# Patient Record
Sex: Female | Born: 1965 | Race: White | Hispanic: No | Marital: Married | Smoking: Never smoker
Health system: Southern US, Community
[De-identification: ages and names within clinical notes are randomized; demographics above are authoritative.]

## PROBLEM LIST (undated history)

## (undated) DIAGNOSIS — E785 Hyperlipidemia, unspecified: Secondary | ICD-10-CM

## (undated) DIAGNOSIS — B019 Varicella without complication: Secondary | ICD-10-CM

## (undated) DIAGNOSIS — R87619 Unspecified abnormal cytological findings in specimens from cervix uteri: Secondary | ICD-10-CM

## (undated) DIAGNOSIS — R519 Headache, unspecified: Secondary | ICD-10-CM

## (undated) DIAGNOSIS — M1990 Unspecified osteoarthritis, unspecified site: Secondary | ICD-10-CM

## (undated) DIAGNOSIS — G5600 Carpal tunnel syndrome, unspecified upper limb: Secondary | ICD-10-CM

## (undated) DIAGNOSIS — K5790 Diverticulosis of intestine, part unspecified, without perforation or abscess without bleeding: Secondary | ICD-10-CM

## (undated) HISTORY — DX: Carpal tunnel syndrome, unspecified upper limb: G56.00

## (undated) HISTORY — DX: Diverticulosis of intestine, part unspecified, without perforation or abscess without bleeding: K57.90

## (undated) HISTORY — DX: Unspecified abnormal cytological findings in specimens from cervix uteri: R87.619

## (undated) HISTORY — DX: Headache, unspecified: R51.9

## (undated) HISTORY — DX: Unspecified osteoarthritis, unspecified site: M19.90

## (undated) HISTORY — PX: COLPOSCOPY: SHX161

## (undated) HISTORY — DX: Hyperlipidemia, unspecified: E78.5

## (undated) HISTORY — DX: Varicella without complication: B01.9

---

## 1997-10-25 ENCOUNTER — Observation Stay (HOSPITAL_COMMUNITY): Admission: RE | Admit: 1997-10-25 | Discharge: 1997-10-25 | Payer: Self-pay | Admitting: Neurosurgery

## 2013-12-15 DIAGNOSIS — R87612 Low grade squamous intraepithelial lesion on cytologic smear of cervix (LGSIL): Secondary | ICD-10-CM | POA: Insufficient documentation

## 2013-12-15 DIAGNOSIS — Z8742 Personal history of other diseases of the female genital tract: Secondary | ICD-10-CM | POA: Insufficient documentation

## 2015-11-15 LAB — HM COLONOSCOPY

## 2016-08-21 ENCOUNTER — Other Ambulatory Visit: Payer: Self-pay | Admitting: Pediatrics

## 2016-08-21 DIAGNOSIS — Z1231 Encounter for screening mammogram for malignant neoplasm of breast: Secondary | ICD-10-CM

## 2016-08-30 ENCOUNTER — Ambulatory Visit
Admission: RE | Admit: 2016-08-30 | Discharge: 2016-08-30 | Disposition: A | Discharge status: Home / Self Care | Payer: BLUE CROSS/BLUE SHIELD | Source: Ambulatory Visit | Attending: Pediatrics | Admitting: Pediatrics

## 2016-08-30 DIAGNOSIS — Z1231 Encounter for screening mammogram for malignant neoplasm of breast: Secondary | ICD-10-CM | POA: Diagnosis not present

## 2016-09-05 ENCOUNTER — Other Ambulatory Visit: Payer: Self-pay | Admitting: *Deleted

## 2016-09-05 ENCOUNTER — Inpatient Hospital Stay
Admission: RE | Admit: 2016-09-05 | Discharge: 2016-09-05 | Disposition: A | Discharge status: Home / Self Care | Payer: Self-pay | Source: Ambulatory Visit | Attending: *Deleted | Admitting: *Deleted

## 2016-09-05 DIAGNOSIS — Z9289 Personal history of other medical treatment: Secondary | ICD-10-CM

## 2017-05-07 DIAGNOSIS — Z124 Encounter for screening for malignant neoplasm of cervix: Secondary | ICD-10-CM | POA: Diagnosis not present

## 2017-05-07 DIAGNOSIS — Z1231 Encounter for screening mammogram for malignant neoplasm of breast: Secondary | ICD-10-CM | POA: Diagnosis not present

## 2017-05-07 DIAGNOSIS — R87610 Atypical squamous cells of undetermined significance on cytologic smear of cervix (ASC-US): Secondary | ICD-10-CM | POA: Diagnosis not present

## 2017-06-17 DIAGNOSIS — R87619 Unspecified abnormal cytological findings in specimens from cervix uteri: Secondary | ICD-10-CM | POA: Diagnosis not present

## 2017-06-17 DIAGNOSIS — R87610 Atypical squamous cells of undetermined significance on cytologic smear of cervix (ASC-US): Secondary | ICD-10-CM | POA: Diagnosis not present

## 2017-06-17 DIAGNOSIS — Z3202 Encounter for pregnancy test, result negative: Secondary | ICD-10-CM | POA: Diagnosis not present

## 2017-06-17 DIAGNOSIS — N870 Mild cervical dysplasia: Secondary | ICD-10-CM | POA: Diagnosis not present

## 2017-07-23 ENCOUNTER — Other Ambulatory Visit: Payer: Self-pay | Admitting: Nurse Practitioner

## 2017-07-23 DIAGNOSIS — Z1231 Encounter for screening mammogram for malignant neoplasm of breast: Secondary | ICD-10-CM

## 2017-09-08 ENCOUNTER — Ambulatory Visit
Admission: RE | Admit: 2017-09-08 | Discharge: 2017-09-08 | Disposition: A | Discharge status: Home / Self Care | Payer: BLUE CROSS/BLUE SHIELD | Source: Ambulatory Visit | Attending: Nurse Practitioner | Admitting: Nurse Practitioner

## 2017-09-08 DIAGNOSIS — Z1231 Encounter for screening mammogram for malignant neoplasm of breast: Secondary | ICD-10-CM | POA: Insufficient documentation

## 2018-08-18 ENCOUNTER — Encounter: Payer: Self-pay | Admitting: Obstetrics and Gynecology

## 2018-08-26 ENCOUNTER — Telehealth: Payer: Self-pay

## 2018-08-26 NOTE — Telephone Encounter (Signed)
Coronavirus (COVID-19) Are you at risk?  Are you at risk for the Coronavirus (COVID-19)?  To be considered HIGH RISK for Coronavirus (COVID-19), you have to meet the following criteria:  . Traveled to [place], [place], [place], [place] or [place]; or in the [place] to [place], [place], [place], or [place]; and have fever, cough, and shortness of breath within the last 2 weeks of travel OR . Been in close contact with a person diagnosed with COVID-19 within the last 2 weeks and have fever, cough, and shortness of breath . IF YOU DO NOT MEET THESE CRITERIA, YOU ARE CONSIDERED LOW RISK FOR COVID-19.  What to do if you are HIGH RISK for COVID-19?  . If you are having a medical emergency, call 911. . Seek medical care right away. Before you go to a doctor's office, urgent care or emergency department, call ahead and tell them about your recent travel, contact with someone diagnosed with COVID-19, and your symptoms. You should receive instructions from your physician's office regarding next steps of care.  . When you arrive at healthcare provider, tell the healthcare staff immediately you have returned from visiting [place], [place], [place], [place] or [place]; or traveled in the [place] to [place], [place], [place], or [place]; in the last two weeks or you have been in close contact with a person diagnosed with COVID-19 in the last 2 weeks.   . Tell the health care staff about your symptoms: fever, cough and shortness of breath. . After you have been seen by a medical provider, you will be either: o Tested for (COVID-19) and discharged home on quarantine except to seek medical care if symptoms worsen, and asked to  - Stay home and avoid contact with others until you get your results (4-5 days)  - Avoid travel on public transportation if possible (such as bus, train, or airplane) or o Sent to the Emergency Department by EMS for evaluation, COVID-19 testing, and possible  admission depending on your condition and test results.  What to do if you are LOW RISK for COVID-19?  Reduce your risk of any infection by using the same precautions used for avoiding the common cold or flu:  . Wash your hands often with soap and warm water for at least 20 seconds.  If soap and water are not readily available, use an alcohol-based hand sanitizer with at least 60% alcohol.  . If coughing or sneezing, cover your mouth and nose by coughing or sneezing into the elbow areas of your shirt or coat, into a tissue or into your sleeve (not your hands). . Avoid shaking hands with others and consider head nods or verbal greetings only. . Avoid touching your eyes, nose, or mouth with unwashed hands.  . Avoid close contact with people who are sick. . Avoid places or events with large numbers of people in one location, like concerts or sporting events. . Carefully consider travel plans you have or are making. . If you are planning any travel outside or inside the [place], visit the CDC's Travelers' Health webpage for the latest health notices. . If you have some symptoms but not all symptoms, continue to monitor at home and seek medical attention if your symptoms worsen. . If you are having a medical emergency, call 911.   ADDITIONAL HEALTHCARE OPTIONS FOR PATIENTS  [facility] / e-Visit: [email or web address]         [facility]: [phone]  [facility]: [phone]                   [facility]: [phone]   Prescreened. Neg .cm 

## 2018-08-27 ENCOUNTER — Other Ambulatory Visit: Payer: Self-pay

## 2018-08-27 ENCOUNTER — Other Ambulatory Visit (HOSPITAL_COMMUNITY)
Admission: RE | Admit: 2018-08-27 | Discharge: 2018-08-27 | Disposition: A | Discharge status: Home / Self Care | Payer: BLUE CROSS/BLUE SHIELD | Source: Ambulatory Visit | Attending: Obstetrics and Gynecology | Admitting: Obstetrics and Gynecology

## 2018-08-27 ENCOUNTER — Encounter: Payer: Self-pay | Admitting: Obstetrics and Gynecology

## 2018-08-27 ENCOUNTER — Ambulatory Visit (INDEPENDENT_AMBULATORY_CARE_PROVIDER_SITE_OTHER): Payer: BC Managed Care – PPO | Admitting: Obstetrics and Gynecology

## 2018-08-27 VITALS — BP 131/88 | HR 99 | Ht 63.5 in | Wt 206.4 lb

## 2018-08-27 DIAGNOSIS — Z01419 Encounter for gynecological examination (general) (routine) without abnormal findings: Secondary | ICD-10-CM | POA: Diagnosis not present

## 2018-08-27 DIAGNOSIS — Z6835 Body mass index (BMI) 35.0-35.9, adult: Secondary | ICD-10-CM | POA: Diagnosis not present

## 2018-08-27 DIAGNOSIS — R87619 Unspecified abnormal cytological findings in specimens from cervix uteri: Secondary | ICD-10-CM

## 2018-08-27 DIAGNOSIS — N926 Irregular menstruation, unspecified: Secondary | ICD-10-CM

## 2018-08-27 DIAGNOSIS — Z713 Dietary counseling and surveillance: Secondary | ICD-10-CM | POA: Diagnosis not present

## 2018-08-27 MED ORDER — PHENTERMINE HCL 37.5 MG PO TABS: 37.5000 mg | ORAL_TABLET | Freq: Every day | ORAL | 2 refills | Status: DC

## 2018-08-27 MED ORDER — CYANOCOBALAMIN 1000 MCG/ML IJ SOLN: 1000.0000 ug | INTRAMUSCULAR | 1 refills | Status: DC

## 2018-08-27 NOTE — Progress Notes (Signed)
Subjective:   Angela Gregory is a 53 y.o. G75P2 Caucasian female here for a routine well-woman exam.  Patient's last menstrual period was 08/14/2018.    Current complaints: irregular menses about every 3 months,  PCP: Goins       does desire labs  Social History: Sexual: heterosexual Marital Status: separated Living situation: with partner / significant other Occupation: Artist Tobacco/alcohol: no tobacco use Illicit drugs: no history of illicit drug use  The following portions of the patient's history were reviewed and updated as appropriate: allergies, current medications, past family history, past medical history, past social history, past surgical history and problem list.  Past Medical History Past Medical History:  Diagnosis Date  . Abnormal Pap smear of cervix     Past Surgical History Past Surgical History:  Procedure Laterality Date  . CESAREAN SECTION    . COLPOSCOPY      Gynecologic History G2P2  Patient's last menstrual period was 08/14/2018. Contraception: none Last Pap: 04/2017. Results were: abnormal Last mammogram: 2019. Results were: normal  Obstetric History OB History  Gravida Para Term Preterm AB Living  2 2          SAB TAB Ectopic Multiple Live Births               # Outcome Date GA Lbr Len/2nd Weight Sex Delivery Anes PTL Lv  2 Para 1997    F CS-LTranv     1 Para 1994    F Vag-Spont EPI      Current Medications No current outpatient medications on file prior to visit.   No current facility-administered medications on file prior to visit.     Review of Systems Patient denies any headaches, blurred vision, shortness of breath, chest pain, abdominal pain, problems with bowel movements, urination, or intercourse.  Objective:  BP 131/88   Pulse 99   Ht 5' 3.5" (1.613 m)   Wt 206 lb 6.4 oz (93.6 kg)   LMP 08/14/2018   BMI 35.99 kg/m  Physical Exam  General:  Well developed, well nourished, no acute distress. She is alert and  oriented x3. Skin:  Warm and dry Neck:  Midline trachea, no thyromegaly or nodules Cardiovascular: Regular rate and rhythm, no murmur heard Lungs:  Effort normal, all lung fields clear to auscultation bilaterally Breasts:  No dominant palpable mass, retraction, or nipple discharge Abdomen:  Soft, non tender, no hepatosplenomegaly or masses Pelvic:  External genitalia is normal in appearance.  The vagina is normal in appearance. The cervix is bulbous, no CMT.  Thin prep pap is done with HR HPV cotesting. Uterus is felt to be normal size, shape, and contour.  No adnexal masses or tenderness noted. Extremities:  No swelling or varicosities noted Psych:  She has a normal mood and affect  Assessment:   Healthy well-woman exam H/o abnormal pap H/o elevated cholesterol BMI 35 Irregular menses  Plan:  Labs will be drawn fasting- will follow up accordingly. Desires starting weight loss program - used adipex in past. B12 given today, will RTC in 4 weeks for labs and wt/bp/B12. F/U 1 year for AE, or sooner if needed Mammogram ordered    Rockney Ghee, CNM

## 2018-08-27 NOTE — Patient Instructions (Addendum)
Exercising to Lose Weight Exercise is structured, repetitive physical activity to improve fitness and health. Getting regular exercise is important for everyone. It is especially important if you are overweight. Being overweight increases your risk of heart disease, stroke, diabetes, high blood pressure, and several types of cancer. Reducing your calorie intake and exercising can help you lose weight. Exercise is usually categorized as moderate or vigorous intensity. To lose weight, most people need to do a certain amount of moderate-intensity or vigorous-intensity exercise each week. Moderate-intensity exercise  Moderate-intensity exercise is any activity that gets you moving enough to burn at least three times more energy (calories) than if you were sitting. Examples of moderate exercise include:  Walking a mile in 15 minutes.  Doing light yard work.  Biking at an easy pace. Most people should get at least 150 minutes (2 hours and 30 minutes) a week of moderate-intensity exercise to maintain their body weight. Vigorous-intensity exercise Vigorous-intensity exercise is any activity that gets you moving enough to burn at least six times more calories than if you were sitting. When you exercise at this intensity, you should be working hard enough that you are not able to carry on a conversation. Examples of vigorous exercise include:  Running.  Playing a team sport, such as football, basketball, and soccer.  Jumping rope. Most people should get at least 75 minutes (1 hour and 15 minutes) a week of vigorous-intensity exercise to maintain their body weight. How can exercise affect me? When you exercise enough to burn more calories than you eat, you lose weight. Exercise also reduces body fat and builds muscle. The more muscle you have, the more calories you burn. Exercise also:  Improves mood.  Reduces stress and tension.  Improves your overall fitness, flexibility, and  endurance.  Increases bone strength. The amount of exercise you need to lose weight depends on:  Your age.  The type of exercise.  Any health conditions you have.  Your overall physical ability. Talk to your health care provider about how much exercise you need and what types of activities are safe for you. What actions can I take to lose weight? Nutrition   Make changes to your diet as told by your health care provider or diet and nutrition specialist (dietitian). This may include: ? Eating fewer calories. ? Eating more protein. ? Eating less unhealthy fats. ? Eating a diet that includes fresh fruits and vegetables, whole grains, low-fat dairy products, and lean protein. ? Avoiding foods with added fat, salt, and sugar.  Drink plenty of water while you exercise to prevent dehydration or heat stroke. Activity  Choose an activity that you enjoy and set realistic goals. Your health care provider can help you make an exercise plan that works for you.  Exercise at a moderate or vigorous intensity most days of the week. ? The intensity of exercise may vary from person to person. You can tell how intense a workout is for you by paying attention to your breathing and heartbeat. Most people will notice their breathing and heartbeat get faster with more intense exercise.  Do resistance training twice each week, such as: ? Push-ups. ? Sit-ups. ? Lifting weights. ? Using resistance bands.  Getting short amounts of exercise can be just as helpful as long structured periods of exercise. If you have trouble finding time to exercise, try to include exercise in your daily routine. ? Get up, stretch, and walk around every 30 minutes throughout the day. ? Go for a  walk during your lunch break. ? Park your car farther away from your destination. ? If you take public transportation, get off one stop early and walk the rest of the way. ? Make phone calls while standing up and walking  around. ? Take the stairs instead of elevators or escalators.  Wear comfortable clothes and shoes with good support.  Do not exercise so much that you hurt yourself, feel dizzy, or get very short of breath. Where to find more information  [place] Department of Health and Human Services: ThisPath.fi  Centers for Disease Control and Prevention (CDC): FootballExhibition.com.br Contact a health care provider:  Before starting a new exercise program.  If you have questions or concerns about your weight.  If you have a medical problem that keeps you from exercising. Get help right away if you have any of the following while exercising:  Injury.  Dizziness.  Difficulty breathing or shortness of breath that does not go away when you stop exercising.  Chest pain.  Rapid heartbeat. Summary  Being overweight increases your risk of heart disease, stroke, diabetes, high blood pressure, and several types of cancer.  Losing weight happens when you burn more calories than you eat.  Reducing the amount of calories you eat in addition to getting regular moderate or vigorous exercise each week helps you lose weight. This information is not intended to replace advice given to you by your health care provider. Make sure you discuss any questions you have with your health care provider. Document Released: 04/06/2010 Document Revised: 03/17/2017 Document Reviewed: 03/17/2017 Elsevier Interactive Patient Education  2019 Elsevier Inc. Cholesterol Cholesterol is a white, waxy, fat-like substance that is needed by the human body in small amounts. The liver makes all the cholesterol we need. Cholesterol is carried from the liver by the blood through the blood vessels. Deposits of cholesterol (plaques) may build up on blood vessel (artery) walls. Plaques make the arteries narrower and stiffer. Cholesterol plaques increase the risk for heart attack and stroke. You cannot feel your cholesterol level even if it is very  high. The only way to know that it is high is to have a blood test. Once you know your cholesterol levels, you should keep a record of the test results. Work with your health care provider to keep your levels in the desired range. What do the results mean?  Total cholesterol is a rough measure of all the cholesterol in your blood.  LDL (low-density lipoprotein) is the bad cholesterol. This is the type that causes plaque to build up on the artery walls. You want this level to be low.  HDL (high-density lipoprotein) is the good cholesterol because it cleans the arteries and carries the LDL away. You want this level to be high.  Triglycerides are fat that the body can either burn for energy or store. High levels are closely linked to heart disease. What are the desired levels of cholesterol?  Total cholesterol below 200.  LDL below 100 for people who are at risk, below 70 for people at very high risk.  HDL above 40 is good. A level of 60 or higher is considered to be protective against heart disease.  Triglycerides below 150. How can I lower my cholesterol? Diet Follow your diet program as told by your health care provider.  Choose fish or white meat chicken and [place], roasted or baked. Limit fatty cuts of red meat, fried foods, and processed meats, such as sausage and lunch meats.  Eat lots  of fresh fruits and vegetables.  Choose whole grains, beans, pasta, potatoes, and cereals.  Choose olive oil, corn oil, or canola oil, and use only small amounts.  Avoid butter, mayonnaise, shortening, or palm kernel oils.  Avoid foods with trans fats.  Drink skim or nonfat milk and eat low-fat or nonfat yogurt and cheeses. Avoid whole milk, cream, ice cream, egg yolks, and full-fat cheeses.  Healthier desserts include angel food cake, ginger snaps, animal crackers, hard candy, popsicles, and low-fat or nonfat frozen yogurt. Avoid pastries, cakes, pies, and cookies.  Exercise  Follow  your exercise program as told by your health care provider. A regular program: ? Helps to decrease LDL and raise HDL. ? Helps with weight control.  Do things that increase your activity level, such as gardening, walking, and taking the stairs.  Ask your health care provider about ways that you can be more active in your daily life. Medicine  Take over-the-counter and prescription medicines only as told by your health care provider. ? Medicine may be prescribed by your health care provider to help lower cholesterol and decrease the risk for heart disease. This is usually done if diet and exercise have failed to bring down cholesterol levels. ? If you have several risk factors, you may need medicine even if your levels are normal. This information is not intended to replace advice given to you by your health care provider. Make sure you discuss any questions you have with your health care provider. Document Released: 11/27/2000 Document Revised: 09/30/2015 Document Reviewed: 09/02/2015 Elsevier Interactive Patient Education  2019 Elsevier Inc.  Human Papillomavirus Human papillomavirus (HPV) is the most common sexually transmitted infection (STI). It easily spreads from person to person (is highly contagious). HPV infections cause genital warts. Certain types of HPV may cause cancers, including cancer of the lower part of the uterus (cervix), vagina, outer female genital area (vulva), penis, anus, and rectum. HPV may also cause cancers of the oral cavity, such as the throat, tongue, and tonsils. There are many types of HPV. It usually does not cause symptoms. However, sometimes there are wart-like lesions in the throat or warts in the genital area that you can see or feel. It is possible to be infected for long periods and pass HPV to others without knowing it. What are the causes? HPV is caused by a virus that spreads from person to person through sexual contact. This includes oral, vaginal, or  anal sex. What increases the risk? The following factors may make you more likely to develop this condition:  Having unprotected oral, vaginal, or anal sex.  Having several sex partners.  Having a sex partner who has other sex partners.  Having or having had another STI.  Having a weak disease-fighting (immune) system.  Having damaged skin in the genital area. What are the signs or symptoms? Most people who have HPV do not have any symptoms. If symptoms are present, they may include:  Wartlike lesions in the throat (from having oral sex).  Warts on the infected skin or mucous membranes.  Genital warts that may itch, burn, bleed, or be painful during sexual intercourse. How is this diagnosed? If wartlike lesions are present in the throat or if genital warts are present, your health care provider can usually diagnose HPV with a physical exam. Genital warts are easily seen. In females, tests may be used to diagnose HPV, including:  A Pap test. A Pap test takes a sample of cells from your cervix to  check for cancer and HPV infection.  An HPV test. This is similar to a Pap test and involves taking a sample of cells from your cervix.  Using a scope to view the cervix (colposcopy). This may be done if a pelvic exam or Pap test is abnormal. A sample of tissue may be removed for testing (biopsy) during the colposcopy. Currently, there is no test to detect HPV in males. How is this treated? There is no treatment for the virus itself. However, there are treatments for the health problems and symptoms HPV can cause. Your health care provider will monitor you closely after you are treated as HPV can come back and may need treatment again. Treatment for HPV may include:  Medicines, which may be injected or applied to genital warts in a cream, lotion, liquid or gel form.  Use of a probe to apply extreme cold (cryotherapy) to the genital warts.  Application of an intense beam of light (laser  treatment) on the genital warts.  Use of a probe to apply extreme heat (electrocautery) on the genital warts.  Surgery to remove the genital warts. Follow these instructions at home: Medicines  Take over-the-counter and prescription medicines only as told by your health care provider. This include creams for itching or irritation.  Do not treat genital warts with medicines used for treating hand warts. General instructions  Do not touch or scratch the warts.  Do not have sex while you are being treated.  Do not douche or use tampons during treatment (women).  Tell your sex partner about your infection. He or she may also need to be treated.  If you become pregnant, tell your health care provider that you have HPV. Your health care provider will monitor you closely during pregnancy to make sure your baby is safe.  Keep all follow-up visits as told by your health care provider. This is important. How is this prevented?  Talk with your health care provider about getting the HPV vaccines. These vaccines prevent some HPV infections and cancers. The vaccines are recommended for males and females between the ages of 439 and 4126. They will not work if you already have HPV, and they are not recommended for pregnant women.  After treatment, use condoms during sex to prevent future infections.  Have only one sex partner.  Have a sex partner who does not have other sex partners.  Get regular Pap tests as directed by your health care provider. Contact a health care provider if:  The treated skin becomes red, swollen, or painful.  You have a fever.  You feel generally ill.  You feel lumps or pimples sticking out in and around your genital area.  You develop bleeding of the vagina or the treatment area.  You have painful sexual intercourse. Summary  Human papillomavirus (HPV) is the most common sexually transmitted infection (STI) and is highly contagious.  Most people carrying HPV  do not have any symptoms.  HPV can be prevented with vaccination. The vaccine is recommended for males and females between the ages of 569 and 3026.  There is no treatment for the virus itself. However, there are treatments for the health problems and symptoms HPV can cause. This information is not intended to replace advice given to you by your health care provider. Make sure you discuss any questions you have with your health care provider. Document Released: 05/25/2003 Document Revised: 02/11/2016 Document Reviewed: 02/11/2016 Elsevier Interactive Patient Education  2019 [employer]  Menopause Menopause  is the normal time of life when menstrual periods stop completely. It is usually confirmed by 12 months without a menstrual period. The transition to menopause (perimenopause) most often happens between the ages of 57 and 59. During perimenopause, hormone levels change in your body, which can cause symptoms and affect your health. Menopause may increase your risk for:  Loss of bone (osteoporosis), which causes bone breaks (fractures).  Depression.  Hardening and narrowing of the arteries (atherosclerosis), which can cause heart attacks and strokes. What are the causes? This condition is usually caused by a natural change in hormone levels that happens as you get older. The condition may also be caused by surgery to remove both ovaries (bilateral oophorectomy). What increases the risk? This condition is more likely to start at an earlier age if you have certain medical conditions or treatments, including:  A tumor of the pituitary gland in the brain.  A disease that affects the ovaries and hormone production.  Radiation treatment for cancer.  Certain cancer treatments, such as chemotherapy or hormone (anti-estrogen) therapy.  Heavy smoking and excessive alcohol use.  Family history of early menopause. This condition is also more likely to develop earlier in women who are very  thin. What are the signs or symptoms? Symptoms of this condition include:  Hot flashes.  Irregular menstrual periods.  Night sweats.  Changes in feelings about sex. This could be a decrease in sex drive or an increased comfort around your sexuality.  Vaginal dryness and thinning of the vaginal walls. This may cause painful intercourse.  Dryness of the skin and development of wrinkles.  Headaches.  Problems sleeping (insomnia).  Mood swings or irritability.  Memory problems.  Weight gain.  Hair growth on the face and chest.  Bladder infections or problems with urinating. How is this diagnosed? This condition is diagnosed based on your medical history, a physical exam, your age, your menstrual history, and your symptoms. Hormone tests may also be done. How is this treated? In some cases, no treatment is needed. You and your health care provider should make a decision together about whether treatment is necessary. Treatment will be based on your individual condition and preferences. Treatment for this condition focuses on managing symptoms. Treatment may include:  Menopausal hormone therapy (MHT).  Medicines to treat specific symptoms or complications.  Acupuncture.  Vitamin or herbal supplements. Before starting treatment, make sure to let your health care provider know if you have a personal or family history of:  Heart disease.  Breast cancer.  Blood clots.  Diabetes.  Osteoporosis. Follow these instructions at home: Lifestyle  Do not use any products that contain nicotine or tobacco, such as cigarettes and e-cigarettes. If you need help quitting, ask your health care provider.  Get at least 30 minutes of physical activity on 5 or more days each week.  Avoid alcoholic and caffeinated beverages, as well as spicy foods. This may help prevent hot flashes.  Get 7-8 hours of sleep each night.  If you have hot flashes, try: ? Dressing in layers. ? Avoiding  things that may trigger hot flashes, such as spicy food, warm places, or stress. ? Taking slow, deep breaths when a hot flash starts. ? Keeping a fan in your home and office.  Find ways to manage stress, such as deep breathing, meditation, or journaling.  Consider going to group therapy with other women who are having menopause symptoms. Ask your health care provider about recommended group therapy meetings. Eating and drinking  Eat a healthy, balanced diet that contains whole grains, lean protein, low-fat dairy, and plenty of fruits and vegetables.  Your health care provider may recommend adding more soy to your diet. Foods that contain soy include tofu, tempeh, and soy milk.  Eat plenty of foods that contain calcium and vitamin D for bone health. Items that are rich in calcium include low-fat milk, yogurt, beans, almonds, sardines, broccoli, and kale. Medicines  Take over-the-counter and prescription medicines only as told by your health care provider.  Talk with your health care provider before starting any herbal supplements. If prescribed, take vitamins and supplements as told by your health care provider. These may include: ? Calcium. Women age 54 and older should get 1,200 mg (milligrams) of calcium every day. ? Vitamin D. Women need 600-800 International Units of vitamin D each day. ? Vitamins B12 and B6. Aim for 50 micrograms of B12 and 1.5 mg of B6 each day. General instructions  Keep track of your menstrual periods, including: ? When they occur. ? How heavy they are and how long they last. ? How much time passes between periods.  Keep track of your symptoms, noting when they start, how often you have them, and how long they last.  Use vaginal lubricants or moisturizers to help with vaginal dryness and improve comfort during sex.  Keep all follow-up visits as told by your health care provider. This is important. This includes any group therapy or counseling. Contact a  health care provider if:  You are still having menstrual periods after age 555.  You have pain during sex.  You have not had a period for 12 months and you develop vaginal bleeding. Get help right away if:  You have: ? Severe depression. ? Excessive vaginal bleeding. ? Pain when you urinate. ? A fast or irregular heart beat (palpitations). ? Severe headaches. ? Abdomen (abdominal) pain or severe indigestion.  You fell and you think you have a broken bone.  You develop leg or chest pain.  You develop vision problems.  You feel a lump in your breast. Summary  Menopause is the normal time of life when menstrual periods stop completely. It is usually confirmed by 12 months without a menstrual period.  The transition to menopause (perimenopause) most often happens between the ages of 7045 and 7455.  Symptoms can be managed through medicines, lifestyle changes, and complementary therapies such as acupuncture.  Eat a balanced diet that is rich in nutrients to promote bone health and heart health and to manage symptoms during menopause. This information is not intended to replace advice given to you by your health care provider. Make sure you discuss any questions you have with your health care provider. Document Released: 05/25/2003 Document Revised: 04/06/2016 Document Reviewed: 04/06/2016 Elsevier Interactive Patient Education  2019 [employer]

## 2018-09-04 LAB — CYTOLOGY - PAP
Diagnosis: NEGATIVE
HPV 16/18/45 genotyping: NEGATIVE
HPV: DETECTED — AB

## 2018-09-24 ENCOUNTER — Encounter: Payer: BC Managed Care – PPO | Admitting: Obstetrics and Gynecology

## 2018-10-30 ENCOUNTER — Encounter: Payer: BC Managed Care – PPO | Admitting: Obstetrics and Gynecology

## 2018-12-11 ENCOUNTER — Ambulatory Visit
Admission: RE | Admit: 2018-12-11 | Discharge: 2018-12-11 | Disposition: A | Discharge status: Home / Self Care | Payer: BC Managed Care – PPO | Source: Ambulatory Visit | Attending: Obstetrics and Gynecology | Admitting: Obstetrics and Gynecology

## 2018-12-11 DIAGNOSIS — Z01419 Encounter for gynecological examination (general) (routine) without abnormal findings: Secondary | ICD-10-CM | POA: Insufficient documentation

## 2018-12-11 DIAGNOSIS — Z1231 Encounter for screening mammogram for malignant neoplasm of breast: Secondary | ICD-10-CM | POA: Insufficient documentation

## 2018-12-21 ENCOUNTER — Other Ambulatory Visit: Payer: Self-pay

## 2018-12-21 ENCOUNTER — Ambulatory Visit (INDEPENDENT_AMBULATORY_CARE_PROVIDER_SITE_OTHER): Payer: BC Managed Care – PPO | Admitting: Primary Care

## 2018-12-21 ENCOUNTER — Encounter: Payer: Self-pay | Admitting: Primary Care

## 2018-12-21 VITALS — BP 126/86 | HR 88 | Temp 97.7°F | Ht 62.75 in | Wt 219.5 lb

## 2018-12-21 DIAGNOSIS — G4730 Sleep apnea, unspecified: Secondary | ICD-10-CM

## 2018-12-21 DIAGNOSIS — G5603 Carpal tunnel syndrome, bilateral upper limbs: Secondary | ICD-10-CM

## 2018-12-21 DIAGNOSIS — K219 Gastro-esophageal reflux disease without esophagitis: Secondary | ICD-10-CM

## 2018-12-21 DIAGNOSIS — E785 Hyperlipidemia, unspecified: Secondary | ICD-10-CM | POA: Diagnosis not present

## 2018-12-21 DIAGNOSIS — Z832 Family history of diseases of the blood and blood-forming organs and certain disorders involving the immune mechanism: Secondary | ICD-10-CM

## 2018-12-21 DIAGNOSIS — Z23 Encounter for immunization: Secondary | ICD-10-CM

## 2018-12-21 DIAGNOSIS — G43709 Chronic migraine without aura, not intractable, without status migrainosus: Secondary | ICD-10-CM | POA: Diagnosis not present

## 2018-12-21 DIAGNOSIS — G5600 Carpal tunnel syndrome, unspecified upper limb: Secondary | ICD-10-CM | POA: Insufficient documentation

## 2018-12-21 LAB — COMPREHENSIVE METABOLIC PANEL
ALT: 26 U/L (ref 0–35)
AST: 17 U/L (ref 0–37)
Albumin: 4.3 g/dL (ref 3.5–5.2)
Alkaline Phosphatase: 59 U/L (ref 39–117)
BUN: 9 mg/dL (ref 6–23)
CO2: 30 mEq/L (ref 19–32)
Calcium: 9.5 mg/dL (ref 8.4–10.5)
Chloride: 100 mEq/L (ref 96–112)
Creatinine, Ser: 0.73 mg/dL (ref 0.40–1.20)
GFR: 82.98 mL/min (ref >60.00)
Glucose, Bld: 86 mg/dL (ref 70–99)
Potassium: 4.1 mEq/L (ref 3.5–5.1)
Sodium: 138 mEq/L (ref 135–145)
Total Bilirubin: 0.6 mg/dL (ref 0.2–1.2)
Total Protein: 6.5 g/dL (ref 6.0–8.3)

## 2018-12-21 LAB — CBC
HCT: 43.2 % (ref 36.0–46.0)
Hemoglobin: 14.9 g/dL (ref 12.0–15.0)
MCHC: 34.4 g/dL (ref 30.0–36.0)
MCV: 91.6 fl (ref 78.0–100.0)
Platelets: 243 10*3/uL (ref 150.0–400.0)
RBC: 4.72 Mil/uL (ref 3.87–5.11)
RDW: 12.6 % (ref 11.5–15.5)
WBC: 9.7 10*3/uL (ref 4.0–10.5)

## 2018-12-21 LAB — LIPID PANEL
Cholesterol: 264 mg/dL — ABNORMAL HIGH (ref 0–200)
HDL: 48.7 mg/dL (ref >39.00)
LDL Cholesterol: 181 mg/dL — ABNORMAL HIGH (ref 0–99)
NonHDL: 215.65
Total CHOL/HDL Ratio: 5
Triglycerides: 172 mg/dL — ABNORMAL HIGH (ref 0.0–149.0)
VLDL: 34.4 mg/dL (ref 0.0–40.0)

## 2018-12-21 LAB — HEMOGLOBIN A1C: Hgb A1c MFr Bld: 5.7 % (ref 4.6–6.5)

## 2018-12-21 LAB — TSH: TSH: 1.48 u[IU]/mL (ref 0.35–4.50)

## 2018-12-21 MED ORDER — SUMATRIPTAN SUCCINATE 25 MG PO TABS: ORAL_TABLET | ORAL | 0 refills | Status: DC

## 2018-12-21 NOTE — Patient Instructions (Signed)
Stop by the lab prior to leaving today. I will notify you of your results once received.   You will be contacted regarding your referral to pulmonology for CPAP calibration.  Please let us know if you have not been contacted within one week.   You can take the sumatriptan (Imitrex) at migraine onset, may repeat in 2 hours if migraine persists.   Start exercising. You should be getting 150 minutes of moderate intensity exercise weekly.  It's important to improve your diet by reducing consumption of fast food, fried food, processed snack foods, sugary drinks. Increase consumption of fresh vegetables and fruits, whole grains, water.  Ensure you are drinking 64 ounces of water daily.  It was a pleasure to meet you today! Please don't hesitate to call or message me with any questions. Welcome to [facility]!

## 2018-12-21 NOTE — Assessment & Plan Note (Signed)
Diagnosed years ago, compliant to CPAP nightly, continue same. Referral placed to pulmonology for management.

## 2018-12-21 NOTE — Assessment & Plan Note (Signed)
Prior history, could not tolerate atorvastatin or rosuvastatin. Did well on Simvastatin so we will consider this for treatment if needed.  Lipid panel pending.

## 2018-12-21 NOTE — Assessment & Plan Note (Signed)
Overall infrequent, improved from years back. Rx for Imitrex provided to use PRN.  Continue to monitor.

## 2018-12-21 NOTE — Assessment & Plan Note (Signed)
Chronic with numbness and pain. Wearing braces now. She would like to continue with conservative treatment for now, will update if symptoms progress. Consider orthopedic referral if needed.

## 2018-12-21 NOTE — Progress Notes (Signed)
Subjective:    Patient ID: Angela Gregory, female    DOB: September 06, 1965, 53 y.o.   MRN: [id number]  HPI  [name] is a 53 year old female who presents today to establish care and to discuss the problems mentioned below. Will obtain/review records.  BP Readings from Last 3 Encounters:  12/21/18 126/86  08/27/18 131/88   1) Hyperlipidemia: Diagnosed years ago, was managed on Lipitor, Crestor, Simvastatin at various times. She had significant myalgias on Lipitor and Crestor, did better on Simvastatin. She has not had treatment in three years. No recent lipid panel on file.  2) Obesity: Currently prescribed Phentermine which was prescribed recently by her GYN. She is not taking Phentermine now due to side effects of dizziness and visual disturbance. She is not exercising, endorses weight gain during Covid-19.   3) Migraines/Frequent Headaches: Long history that dates back years ago. She will experience 1-2 migraines every 3-4 months which occur to the frontal lobes. Also with photophobia. She is now taking Tylenol and using ice with improvement. Previously managed on Imitrex which helped, has not taken in years. Never on preventative treatment.   4) OSA: Diagnosed around 2013. Compliant to CPAP nightly, no recent calibration. Moved from [place], [place] recently.  Review of Systems  Eyes: Negative for visual disturbance.  Respiratory:       Exertional SOB with moderate exertion   Cardiovascular: Negative for chest pain.  Neurological:       Migraines intermittently       Past Medical History:  Diagnosis Date  . Abnormal Pap smear of cervix   . Arthritis    Neck, hands  . Chickenpox   . Frequent headaches   . Hyperlipidemia      Social History   Socioeconomic History  . Marital status: Legally Separated    Spouse name: Not on file  . Number of children: Not on file  . Years of education: Not on file  . Highest education level: Not on file  Occupational History  . Not on file   Social Needs  . Financial resource strain: Not on file  . Food insecurity    Worry: Not on file    Inability: Not on file  . Transportation needs    Medical: Not on file    Non-medical: Not on file  Tobacco Use  . Smoking status: Never Smoker  . Smokeless tobacco: Never Used  Substance and Sexual Activity  . Alcohol use: Yes  . Drug use: Never  . Sexual activity: Yes  Lifestyle  . Physical activity    Days per week: Not on file    Minutes per session: Not on file  . Stress: Not on file  Relationships  . Social Musician on phone: Not on file    Gets together: Not on file    Attends religious service: Not on file    Active member of club or organization: Not on file    Attends meetings of clubs or organizations: Not on file    Relationship status: Not on file  . Intimate partner violence    Fear of current or ex partner: Not on file    Emotionally abused: Not on file    Physically abused: Not on file    Forced sexual activity: Not on file  Other Topics Concern  . Not on file  Social History Narrative   Divorced.   2 children.   Works in Audiological scientist.    Past Surgical History:  Procedure Laterality Date  . BACK SURGERY  2000   Disc removal L4-L5  . CESAREAN SECTION    . COLPOSCOPY      Family History  Problem Relation Age of Onset  . Arthritis Mother   . Heart disease Mother   . Hyperlipidemia Mother   . Diabetes Father   . Factor V Leiden deficiency Father   . Muscular dystrophy Maternal Grandmother   . Heart disease Maternal Grandfather   . COPD Paternal Grandmother   . Breast cancer Neg Hx     No Known Allergies  Current Outpatient Medications on File Prior to Visit  Medication Sig Dispense Refill  . acetaminophen (TYLENOL) 650 MG CR tablet Take 650 mg by mouth every 8 (eight) hours as needed for pain.    . Cetirizine HCl (ZYRTEC PO) Take by mouth.    . MELATONIN PO Take by mouth.     No current facility-administered medications on file  prior to visit.     BP 126/86   Pulse 88   Temp 97.7 F (36.5 C) (Temporal)   Ht 5' 2.75" (1.594 m)   Wt 219 lb 8 oz (99.6 kg)   SpO2 98%   BMI 39.19 kg/m    Objective:   Physical Exam  Constitutional: She appears well-nourished.  Neck: Neck supple.  Cardiovascular: Normal rate and regular rhythm.  Respiratory: Effort normal and breath sounds normal.  Skin: Skin is warm and dry.  Psychiatric: She has a normal mood and affect.           Assessment & Plan:

## 2018-12-22 DIAGNOSIS — Z832 Family history of diseases of the blood and blood-forming organs and certain disorders involving the immune mechanism: Secondary | ICD-10-CM | POA: Diagnosis not present

## 2018-12-22 MED ORDER — SIMVASTATIN 40 MG PO TABS: 40.0000 mg | ORAL_TABLET | Freq: Every evening | ORAL | 3 refills | Status: DC

## 2018-12-22 NOTE — Addendum Note (Signed)
Addended by: Cloyd Stagers on: 12/22/2018 02:15 PM   Modules accepted: Orders

## 2018-12-26 LAB — FACTOR 5 LEIDEN

## 2018-12-27 DIAGNOSIS — D6851 Activated protein C resistance: Secondary | ICD-10-CM

## 2018-12-28 ENCOUNTER — Institutional Professional Consult (permissible substitution): Payer: BC Managed Care – PPO | Admitting: Internal Medicine

## 2019-01-08 ENCOUNTER — Other Ambulatory Visit: Payer: BC Managed Care – PPO

## 2019-01-15 ENCOUNTER — Other Ambulatory Visit: Payer: Self-pay | Admitting: Primary Care

## 2019-01-15 DIAGNOSIS — G43709 Chronic migraine without aura, not intractable, without status migrainosus: Secondary | ICD-10-CM

## 2019-01-17 NOTE — Progress Notes (Signed)
[facility]  [address], [address] [place], [place] [address] Phone: [phone]  Fax: [phone]   Clinic Day:  01/18/2019  Referring physician: Doreene Nestlark, Katherine K, NP  Chief Complaint: Angela Gregory is a 53 y.o. female with a family history of Factor V Leiden who is referred in consultation by Vernona RiegerKatherine Clark, NP  for assessment and management.   HPI: The patient was seen by Vernona RiegerKatherine Clark, NP on 12/21/2018. She noted a family history of Factor V Leiden.  Her father had bilateral pulmonary emboli and a lower extremity DVT.  Hypercoagulable work-up revealed heterozygosity for Factor V Leiden.   There is no other family history of thrombosis.  The patient's brother was subsequently tested and found to be negative.  She underwent Factor V Leiden testing on 12/22/2018; results were positive for one copy (heterozygote).  She has never had a clot.  CBC followed  11/11/2014: Hematocrit 45.7, hemoglobin 14.7, MCV 90.6, platelets 316,000, WBC 12900. 12/21/2014 :Hematocrit 43.1, hemoglobin 14.3, MCV 90.0, platelets 287,000, WBC 13800 12/21/2018: Hematocrit 43.2, hemoglobin 14.9, MCV 91.6, platelets 243,000, WBC 9700.  She has 2 daughters, ages 5722 and 54 years old.  They have not been tested.  She believes both are on BC pills due to painful periods. Neither of them have had children.  Symptomatically, she feels good.  She denies any shortness of breath or lower extremity edema.  She hasn't had menses since 04/2018.  She has hot flashes and no night sweats. She is not on any hormone replacement therapy.  She is up-to-date on her mammogram and colonoscopy.   Past Medical History:  Diagnosis Date  . Abnormal Pap smear of cervix   . Arthritis    Neck, hands  . Carpal tunnel syndrome   . Chickenpox   . Diverticulosis   . Frequent headaches   . Hyperlipidemia     Past Surgical History:  Procedure Laterality Date  . BACK SURGERY  2000   Disc removal L4-L5  .  CESAREAN SECTION    . COLPOSCOPY      Family History  Problem Relation Age of Onset  . Arthritis Mother   . Heart disease Mother   . Hyperlipidemia Mother   . Diabetes Father   . Factor V Leiden deficiency Father   . Muscular dystrophy Maternal Grandmother   . Heart disease Maternal Grandfather   . COPD Paternal Grandmother   . Breast cancer Neg Hx     Social History:  reports that she has never smoked. She has never used smokeless tobacco. She reports current alcohol use. She reports that she does not use drugs. She has 2 daughters who are 5226 and 322.  Her father is Wendy Poetrnest Carden.  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 She sits normally for 8 hrs a day. She gets up at least every hour.  She lives in [place].  The patient is alone today.  Allergies: No Known Allergies  Current Medications: Current Outpatient Medications  Medication Sig Dispense Refill  . Cetirizine HCl (ZYRTEC PO) Take by mouth.    . simvastatin (ZOCOR) 40 MG tablet Take 1 tablet (40 mg total) by mouth every evening. For cholesterol. 90 tablet 3  . acetaminophen (TYLENOL) 650 MG CR tablet Take 650 mg by mouth every 8 (eight) hours as needed for pain.    Marland Kitchen. MELATONIN PO Take by mouth.    . SUMAtriptan (IMITREX) 25 MG tablet Take 1  tablet by mouth at migraine onset, may repeat in 2 hours if headache persists or recurs. (Patient not taking: Reported on 01/18/2019) 10 tablet 0   No current facility-administered medications for this visit.     Review of Systems  Constitutional: Negative.  Negative for chills, diaphoresis, fever, malaise/fatigue and weight loss.  HENT: Negative.  Negative for congestion, ear pain, hearing loss, nosebleeds, sinus pain and sore throat.   Eyes: Negative for blurred vision, double vision and photophobia.       Vision issues with migraines.  Respiratory: Positive for shortness of breath (attributes to weight gain). Negative  for cough, hemoptysis and sputum production.   Cardiovascular: Negative.  Negative for chest pain, palpitations and leg swelling.  Gastrointestinal: Negative.  Negative for abdominal pain, blood in stool, constipation, diarrhea, melena, nausea and vomiting.  Genitourinary: Negative.  Negative for dysuria, flank pain, frequency, hematuria and urgency.  Musculoskeletal: Positive for joint pain (hands) and neck pain (arthritis). Negative for back pain and myalgias.  Skin: Negative.  Negative for itching and rash.  Neurological: Positive for headaches (chronic migraines). Negative for dizziness, tingling, tremors, sensory change, speech change, focal weakness and weakness.  Psychiatric/Behavioral: Negative.  Negative for depression and memory loss. The patient is not nervous/anxious and does not have insomnia.    Performance status (ECOG): 1  Vitals Blood pressure (!) 146/83, pulse 89, temperature 98.5 F (36.9 C), temperature source Tympanic, resp. rate 18, height 5' 2.75" (1.594 m), weight 222 lb 0.1 oz (100.7 kg), SpO2 99 %.   Physical Exam  Constitutional: She is oriented to person, place, and time. She appears well-developed and well-nourished. No distress.  HENT:  Head: Normocephalic and atraumatic.  Mouth/Throat: Oropharynx is clear and moist. No oropharyngeal exudate.  Shoulder length brown hair.  Mask.  Eyes: Pupils are equal, round, and reactive to light. Conjunctivae and EOM are normal. No scleral icterus.  Brown/hazel eyes.  Neck: Normal range of motion. Neck supple. No JVD present.  Cardiovascular: Normal rate, regular rhythm and normal heart sounds.  No murmur heard. Pulmonary/Chest: Breath sounds normal. No respiratory distress. She has no wheezes. She has no rales.  Abdominal: Soft. Bowel sounds are normal. She exhibits no distension and no mass. There is no abdominal tenderness. There is no rebound and no guarding.  Musculoskeletal: Normal range of motion.        General: No  tenderness or edema.  Lymphadenopathy:    She has no cervical adenopathy.  Neurological: She is alert and oriented to person, place, and time.  Skin: Skin is warm and dry. No rash noted. She is not diaphoretic. No erythema. No pallor.  Psychiatric: She has a normal mood and affect. Her behavior is normal. Judgment and thought content normal.  Nursing note and vitals reviewed.   No visits with results within 3 Day(s) from this visit.  Latest known visit with results is:  Patient Message on 12/21/2018  Component Date Value Ref Range Status  . HM Colonoscopy 11/15/2015 See Report (in chart)  See Report (in chart), Patient Reported Final    Assessment:  Karess Harner is a 53 y.o. female with Factor V Leiden heterozygosity.  She has never had a clot.  She has a family history of thrombosis.  Her father has has a DVT and bilateral pulmonary emboli.  He is heterozygote for Factor V Leiden.  Symptomatically, she denies any complaint.  Exam is unremakable.  Plan: 1.   Factor V Leiden heterozygote  Review family history of thrombosis.  Review incidence of Factor V Leiden is approximately 5% in the [place].             Review the difference between Factor V Leiden heterozygote (1 copy) versus homozygous (2 copies) of Factor V Leiden   Review the risk of thrombosis for Factor V Leiden heterozygote is approximately 3-8 fold over the general population.             Review clots are often associated with additional risk factors including malignancy, dehydration, stasis, pregnancy, BCP, other clotting disorders.             Discuss analogy of basket floating on the water.                         Risk factors for thrombosis are rocks in the basket.                         Enough rocks in the basket and the the basket skinks (clot).             Discuss avoiding risk factors that can be controlled.             Discuss preventative anticoagulation for at risk times (pelvic and lower  extremity surgery etc).  Discuss genetics and risk that daughter's have inherited 1 copy of Factor V Leiden from patient (50%).   Encourage testing of daughters. 2.   RTC prn  I discussed the assessment and treatment plan with the patient.  The patient was provided an opportunity to ask questions and all were answered.  The patient agreed with the plan and demonstrated an understanding of the instructions.  The patient was advised to call back if the symptoms worsen or if the condition fails to improve as anticipated.  I provided 27 minutes (11:00 AM - 11:26 AM) of face-to-face time during this this encounter and > 50% was spent counseling as documented under my assessment and plan.     C. Mike Gip, MD, PhD    01/18/2019, 11:26 AM  I, Samul Dada, am acting as scribe for [employer]. Mike Gip, MD, PhD.  I,  C. Mike Gip, MD, have reviewed the above documentation for accuracy and completeness, and I agree with the above.

## 2019-01-18 ENCOUNTER — Other Ambulatory Visit: Payer: Self-pay

## 2019-01-18 ENCOUNTER — Encounter: Payer: Self-pay | Admitting: Hematology and Oncology

## 2019-01-18 ENCOUNTER — Inpatient Hospital Stay: Payer: BC Managed Care – PPO | Attending: Hematology and Oncology | Admitting: Hematology and Oncology

## 2019-01-18 VITALS — BP 146/83 | HR 89 | Temp 98.5°F | Resp 18 | Ht 62.75 in | Wt 222.0 lb

## 2019-01-18 DIAGNOSIS — Z8249 Family history of ischemic heart disease and other diseases of the circulatory system: Secondary | ICD-10-CM | POA: Insufficient documentation

## 2019-01-18 DIAGNOSIS — E785 Hyperlipidemia, unspecified: Secondary | ICD-10-CM | POA: Diagnosis not present

## 2019-01-18 DIAGNOSIS — D6851 Activated protein C resistance: Secondary | ICD-10-CM | POA: Diagnosis not present

## 2019-01-18 DIAGNOSIS — G43909 Migraine, unspecified, not intractable, without status migrainosus: Secondary | ICD-10-CM | POA: Insufficient documentation

## 2019-01-18 DIAGNOSIS — Z79899 Other long term (current) drug therapy: Secondary | ICD-10-CM | POA: Diagnosis not present

## 2019-01-18 DIAGNOSIS — M1990 Unspecified osteoarthritis, unspecified site: Secondary | ICD-10-CM

## 2019-01-20 ENCOUNTER — Institutional Professional Consult (permissible substitution): Payer: BC Managed Care – PPO | Admitting: Internal Medicine

## 2019-02-01 ENCOUNTER — Other Ambulatory Visit: Payer: BC Managed Care – PPO

## 2019-02-15 DIAGNOSIS — E785 Hyperlipidemia, unspecified: Secondary | ICD-10-CM

## 2019-02-15 DIAGNOSIS — G5603 Carpal tunnel syndrome, bilateral upper limbs: Secondary | ICD-10-CM

## 2019-02-15 MED ORDER — ROSUVASTATIN CALCIUM 10 MG PO TABS: 10.0000 mg | ORAL_TABLET | Freq: Every evening | ORAL | 0 refills | Status: DC

## 2019-02-24 ENCOUNTER — Institutional Professional Consult (permissible substitution): Payer: BC Managed Care – PPO | Admitting: Pulmonary Disease

## 2019-03-06 ENCOUNTER — Other Ambulatory Visit: Payer: Self-pay | Admitting: Primary Care

## 2019-03-06 DIAGNOSIS — G43709 Chronic migraine without aura, not intractable, without status migrainosus: Secondary | ICD-10-CM

## 2019-03-09 ENCOUNTER — Other Ambulatory Visit: Payer: Self-pay | Admitting: Primary Care

## 2019-03-09 DIAGNOSIS — E785 Hyperlipidemia, unspecified: Secondary | ICD-10-CM

## 2019-03-10 NOTE — Telephone Encounter (Signed)
See my chart message

## 2019-03-10 NOTE — Telephone Encounter (Signed)
Last prescribed on 12/21/2018 . Last appointment on 12/21/2018. No future appointment

## 2019-03-14 ENCOUNTER — Other Ambulatory Visit: Payer: Self-pay

## 2019-03-14 DIAGNOSIS — E785 Hyperlipidemia, unspecified: Secondary | ICD-10-CM

## 2019-03-31 ENCOUNTER — Other Ambulatory Visit: Payer: Self-pay | Admitting: Primary Care

## 2019-03-31 DIAGNOSIS — G43709 Chronic migraine without aura, not intractable, without status migrainosus: Secondary | ICD-10-CM

## 2019-04-01 ENCOUNTER — Institutional Professional Consult (permissible substitution): Payer: BC Managed Care – PPO | Admitting: Pulmonary Disease

## 2019-04-19 ENCOUNTER — Encounter: Payer: Self-pay | Admitting: Primary Care

## 2019-04-19 ENCOUNTER — Ambulatory Visit (INDEPENDENT_AMBULATORY_CARE_PROVIDER_SITE_OTHER): Payer: BC Managed Care – PPO | Admitting: Primary Care

## 2019-04-19 DIAGNOSIS — R1030 Lower abdominal pain, unspecified: Secondary | ICD-10-CM | POA: Diagnosis not present

## 2019-04-19 DIAGNOSIS — R109 Unspecified abdominal pain: Secondary | ICD-10-CM | POA: Insufficient documentation

## 2019-04-19 HISTORY — DX: Unspecified abdominal pain: R10.9

## 2019-04-19 NOTE — Telephone Encounter (Signed)
Patient has an appointment today (04/19/2019) at 2pm

## 2019-04-19 NOTE — Assessment & Plan Note (Signed)
Acute middle lower abdominal pain intermittently with cramping. Also with moderate, dry, nagging cough with some improvement.  Difficult case as she does have evidence of moderate diverticulosis to her entire colon based off of colonoscopy report from 2017. She's never had acute diverticulitis. Given that she also has a cough and with the indirect exposure to Covid-19 the question of Covid-19 is raised. There are no urinary symptoms.  Unfortunately we were unable to connect via video and she isn't allowed in our clinic due to cough so no exam was done. She sounds stable but did experience an episode of cramping during our visit.  Given abdominal symptoms coupled with cough the best and quickest way to proceed is to have her evaluated in our respiratory clinic or visit Urgent Care. She really needs an in person exam with labs and further work up. She kindly declines as she would like to give her symptoms another 24 hours to see if there is improvement. We discussed the potential risks of developing/untreated diverticulitis, she understands.  She will send an update via My Chart tomorrow. If no improvement then she needs in person evaluation.

## 2019-04-19 NOTE — Progress Notes (Signed)
Subjective:    Patient ID: Angela Gregory, female    DOB: 1966/03/16, 54 y.o.   MRN: [id number]  HPI  Virtual Visit via Video Note  I connected with Angela Gregory on 04/19/19 at  2:00 PM EST by a video enabled telemedicine application and verified that I am speaking with the correct person using two identifiers.  We attempted to connect via video but her camera would not work. We conducted our visit via phone which lasted 21 min 33 sec.  Location: Patient: Home Provider: Office   I discussed the limitations of evaluation and management by telemedicine and the availability of in person appointments. The patient expressed understanding and agreed to proceed.  History of Present Illness:  Angela Gregory is a 54 year old female with a history of migraines, sleep apnea, GERD, Factor V Leiden mutation, hyperlipidemia, diverticulosis without acute diverticulitis who presents today with a chief complaint of abdominal pain.  Symptoms began four days ago with middle lower quadrant abdominal pain (distal to umbilicus), thought this was secondary to her menstrual cycle beginning as she's not had a menstrual cycle in a few months. She describes the pain as menstrual cycle cramping, she's not yet had menses, cramping is intense at times. She woke up at 3 am this morning with the same type of cramping abdominal pain, severe. She has noticed cramping with a sensation of needing to have a bowel movement after some meals.   She denies nausea, vomiting, diarrhea, blood stools, dysuria, urinary frequency. She has noticed constipation, last full bowel movement was two days ago. She's tried taking stool softeners and a laxative with a small bowel movement. A few weeks ago she stopped her diet plan and has been eating more "normal foods" recently. She has eaten a few servings of popcorn one week ago.   Also with a cough that began a few days ago, moderate nagging cough, has since improved but is still present. Some  rhinorrhea. She denies body aches, chills, fevers, loss of taste/smell. Her boyfriend was exposed to Covid-19 five to six days ago by his supervisor at work, he has not shown any obvious symptoms but this is difficult as he has chronic pancreatitis. Her daughter is a Marine scientist at [employer], has worked with a Covid-19 patient recently.     Observations/Objective:  Alert and oriented. No distress. Speaking in complete sentences.  Assessment and Plan:  Acute middle lower abdominal pain intermittently with cramping. Also with moderate, dry, nagging cough with some improvement.  Difficult case as she does have evidence of moderate diverticulosis to her entire colon based off of colonoscopy report from 2017. She's never had acute diverticulitis. Given that she also has a cough and with the indirect exposure to Covid-19 the question of Covid-19 is raised. There are no urinary symptoms.  Unfortunately we were unable to connect via video and she isn't allowed in our clinic due to cough so no exam was done. She sounds stable but did experience an episode of cramping during our visit.  Given abdominal symptoms coupled with cough the best and quickest way to proceed is to have her evaluated in our respiratory clinic or visit Urgent Care. She really needs an in person exam with labs and further work up. She kindly declines as she would like to give her symptoms another 24 hours to see if there is improvement. We discussed the potential risks of developing/untreated diverticulitis, she understands.  She will send an update via My Chart tomorrow. If no  improvement then she needs in person evaluation.   Follow Up Instructions:  Please update me tomorrow as discussed.  Go to the hospital if you develop increased pain, bloody stools, nausea with vomiting, fevers, etc.  It was a pleasure to see you today! Mayra Reel, NP-C    I discussed the assessment and treatment plan with the patient. The patient was provided  an opportunity to ask questions and all were answered. The patient agreed with the plan and demonstrated an understanding of the instructions.   The patient was advised to call back or seek an in-person evaluation if the symptoms worsen or if the condition fails to improve as anticipated.    Doreene Nest, NP    Review of Systems  Constitutional: Negative for fever.  HENT: Positive for rhinorrhea.   Respiratory: Positive for cough.   Gastrointestinal: Positive for abdominal pain and constipation. Negative for blood in stool, nausea and vomiting.  Genitourinary: Negative for dysuria, frequency and hematuria.       Past Medical History:  Diagnosis Date  . Abnormal Pap smear of cervix   . Arthritis    Neck, hands  . Carpal tunnel syndrome   . Chickenpox   . Diverticulosis   . Frequent headaches   . Hyperlipidemia      Social History   Socioeconomic History  . Marital status: Legally Separated    Spouse name: Not on file  . Number of children: Not on file  . Years of education: Not on file  . Highest education level: Not on file  Occupational History  . Not on file  Tobacco Use  . Smoking status: Never Smoker  . Smokeless tobacco: Never Used  Substance and Sexual Activity  . Alcohol use: Yes  . Drug use: Never  . Sexual activity: Yes  Other Topics Concern  . Not on file  Social History Narrative   Divorced.   2 children.   Works in Audiological scientist.   Social Determinants of Health   Financial Resource Strain:   . Difficulty of Paying Living Expenses: Not on file  Food Insecurity:   . Worried About Programme researcher, broadcasting/film/video in the Last Year: Not on file  . Ran Out of Food in the Last Year: Not on file  Transportation Needs:   . Lack of Transportation (Medical): Not on file  . Lack of Transportation (Non-Medical): Not on file  Physical Activity:   . Days of Exercise per Week: Not on file  . Minutes of Exercise per Session: Not on file  Stress:   . Feeling of  Stress : Not on file  Social Connections:   . Frequency of Communication with Friends and Family: Not on file  . Frequency of Social Gatherings with Friends and Family: Not on file  . Attends Religious Services: Not on file  . Active Member of Clubs or Organizations: Not on file  . Attends Banker Meetings: Not on file  . Marital Status: Not on file  Intimate Partner Violence:   . Fear of Current or Ex-Partner: Not on file  . Emotionally Abused: Not on file  . Physically Abused: Not on file  . Sexually Abused: Not on file    Past Surgical History:  Procedure Laterality Date  . BACK SURGERY  2000   Disc removal L4-L5  . CESAREAN SECTION    . COLPOSCOPY      Family History  Problem Relation Age of Onset  . Arthritis Mother   .  Heart disease Mother   . Hyperlipidemia Mother   . Diabetes Father   . Factor V Leiden deficiency Father   . Muscular dystrophy Maternal Grandmother   . Heart disease Maternal Grandfather   . COPD Paternal Grandmother   . Breast cancer Neg Hx     No Known Allergies  Current Outpatient Medications on File Prior to Visit  Medication Sig Dispense Refill  . acetaminophen (TYLENOL) 650 MG CR tablet Take 650 mg by mouth every 8 (eight) hours as needed for pain.    . Cetirizine HCl (ZYRTEC PO) Take by mouth.    . MELATONIN PO Take by mouth.    . rosuvastatin (CRESTOR) 10 MG tablet TAKE 1 TABLET (10 MG TOTAL) BY MOUTH EVERY EVENING. FOR CHOLESTEROL. 90 tablet 3  . SUMAtriptan (IMITREX) 25 MG tablet TAKE 1 TABLET BY MOUTH AT MIGRAINE ONSET, MAY REPEAT IN 2 HOURS IF HEADACHE PERSISTS OR RECURS. 10 tablet 0   No current facility-administered medications on file prior to visit.    BP (!) 141/85   Temp 97.6 F (36.4 C) (Temporal)   Ht 5' 2.75" (1.594 m)   Wt 219 lb (99.3 kg)   BMI 39.10 kg/m    Objective:   Physical Exam  Constitutional: She is oriented to person, place, and time.  Respiratory: Effort normal.  Neurological: She is  alert and oriented to person, place, and time.  Psychiatric: She has a normal mood and affect.           Assessment & Plan:

## 2019-04-19 NOTE — Patient Instructions (Signed)
Please update me tomorrow as discussed.  Go to the hospital if you develop increased pain, bloody stools, nausea with vomiting, fevers, etc.  It was a pleasure to see you today! Mayra Reel, NP-C

## 2019-04-20 ENCOUNTER — Ambulatory Visit
Admission: RE | Admit: 2019-04-20 | Discharge: 2019-04-20 | Disposition: A | Discharge status: Home / Self Care | Payer: BC Managed Care – PPO | Source: Ambulatory Visit | Attending: Primary Care | Admitting: Primary Care

## 2019-04-20 ENCOUNTER — Ambulatory Visit: Payer: BC Managed Care – PPO | Admitting: Primary Care

## 2019-04-20 ENCOUNTER — Other Ambulatory Visit: Payer: Self-pay | Admitting: Primary Care

## 2019-04-20 ENCOUNTER — Other Ambulatory Visit: Payer: Self-pay

## 2019-04-20 ENCOUNTER — Encounter: Payer: Self-pay | Admitting: Primary Care

## 2019-04-20 DIAGNOSIS — R109 Unspecified abdominal pain: Secondary | ICD-10-CM | POA: Diagnosis not present

## 2019-04-20 DIAGNOSIS — K5792 Diverticulitis of intestine, part unspecified, without perforation or abscess without bleeding: Secondary | ICD-10-CM

## 2019-04-20 DIAGNOSIS — R1030 Lower abdominal pain, unspecified: Secondary | ICD-10-CM | POA: Insufficient documentation

## 2019-04-20 LAB — COMPREHENSIVE METABOLIC PANEL
ALT: 17 U/L (ref 0–35)
AST: 12 U/L (ref 0–37)
Albumin: 4.3 g/dL (ref 3.5–5.2)
Alkaline Phosphatase: 57 U/L (ref 39–117)
BUN: 11 mg/dL (ref 6–23)
CO2: 30 mEq/L (ref 19–32)
Calcium: 9.6 mg/dL (ref 8.4–10.5)
Chloride: 101 mEq/L (ref 96–112)
Creatinine, Ser: 0.77 mg/dL (ref 0.40–1.20)
GFR: 77.93 mL/min (ref >60.00)
Glucose, Bld: 95 mg/dL (ref 70–99)
Potassium: 4.1 mEq/L (ref 3.5–5.1)
Sodium: 138 mEq/L (ref 135–145)
Total Bilirubin: 0.7 mg/dL (ref 0.2–1.2)
Total Protein: 7.3 g/dL (ref 6.0–8.3)

## 2019-04-20 LAB — CBC WITH DIFFERENTIAL/PLATELET
Basophils Absolute: 0.1 10*3/uL (ref 0.0–0.1)
Basophils Relative: 0.4 % (ref 0.0–3.0)
Eosinophils Absolute: 0.1 10*3/uL (ref 0.0–0.7)
Eosinophils Relative: 1 % (ref 0.0–5.0)
HCT: 42.4 % (ref 36.0–46.0)
Hemoglobin: 14.4 g/dL (ref 12.0–15.0)
Lymphocytes Relative: 21.6 % (ref 12.0–46.0)
Lymphs Abs: 2.8 10*3/uL (ref 0.7–4.0)
MCHC: 34 g/dL (ref 30.0–36.0)
MCV: 92 fl (ref 78.0–100.0)
Monocytes Absolute: 0.8 10*3/uL (ref 0.1–1.0)
Monocytes Relative: 6.4 % (ref 3.0–12.0)
Neutro Abs: 9 10*3/uL — ABNORMAL HIGH (ref 1.4–7.7)
Neutrophils Relative %: 70.6 % (ref 43.0–77.0)
Platelets: 254 10*3/uL (ref 150.0–400.0)
RBC: 4.6 Mil/uL (ref 3.87–5.11)
RDW: 12.5 % (ref 11.5–15.5)
WBC: 12.8 10*3/uL — ABNORMAL HIGH (ref 4.0–10.5)

## 2019-04-20 MED ORDER — METRONIDAZOLE 500 MG PO TABS: 500.0000 mg | ORAL_TABLET | Freq: Three times a day (TID) | ORAL | 0 refills | Status: DC

## 2019-04-20 MED ORDER — CIPROFLOXACIN HCL 500 MG PO TABS: 500.0000 mg | ORAL_TABLET | Freq: Two times a day (BID) | ORAL | 0 refills | Status: DC

## 2019-04-20 MED ORDER — IOHEXOL 300 MG/ML  SOLN
100.0000 mL | Freq: Once | INTRAMUSCULAR | Status: AC | PRN
  Administered 2019-04-20: 14:00:00 100 mL via INTRAVENOUS

## 2019-04-20 NOTE — Patient Instructions (Signed)
Stop by the lab prior to leaving today. I will notify you of your results once received.   Stop by the front desk and speak with either Marion or Charmaine regarding your CT scan.   It was a pleasure to see you today!   

## 2019-04-20 NOTE — Addendum Note (Signed)
Addended by: Doreene Nest on: 04/20/2019 04:47 PM   Modules accepted: Level of Service

## 2019-04-20 NOTE — Progress Notes (Signed)
Subjective:    Patient ID: Angela Gregory, female    DOB: [birth year], 54 y.o.   MRN: [id number]  HPI  This visit occurred during the SARS-CoV-2 public health emergency.  Safety protocols were in place, including screening questions prior to the visit, additional usage of staff PPE, and extensive cleaning of exam room while observing appropriate contact time as indicated for disinfecting solutions.   [name] is a 54 year old female with a history of diverticulosis without acute diverticulitis, chronic migraines, GERD, Factor Leiden V mutation who presents today with a chief complaint of abdominal pain.  She was evaluated via phone yesterday with reports of acute middle lower abdominal pain intermittently with cramping x several days. The pain woke her from sleep yesterday. No other symptoms of nausea/vomiting, diarrhea, urinary symptoms. It was recommended she be evaluated at the urgent care or respiratory clinic due to a cough and inability to come into our clinic. She kindly declined.  This morning she sent a message stating that pain was not improved but cough was better so she was brought into our office. Today she's also noticed left lower abdominal discomfort, continues with lower mid abdominal region. She denies nausea/vomiting, diarrhea, fevers. Her pain waxes and wanes, no better overall.   Review of Systems  Constitutional: Negative for fever.  Gastrointestinal: Positive for abdominal pain. Negative for constipation, diarrhea, nausea and vomiting.       Past Medical History:  Diagnosis Date  . Abnormal Pap smear of cervix   . Arthritis    Neck, hands  . Carpal tunnel syndrome   . Chickenpox   . Diverticulosis   . Frequent headaches   . Hyperlipidemia      Social History   Socioeconomic History  . Marital status: Legally Separated    Spouse name: Not on file  . Number of children: Not on file  . Years of education: Not on file  . Highest education level: Not on file    Occupational History  . Not on file  Tobacco Use  . Smoking status: Never Smoker  . Smokeless tobacco: Never Used  Substance and Sexual Activity  . Alcohol use: Yes  . Drug use: Never  . Sexual activity: Yes  Other Topics Concern  . Not on file  Social History Narrative   Divorced.   2 children.   Works in Press photographer.   Social Determinants of Health   Financial Resource Strain:   . Difficulty of Paying Living Expenses: Not on file  Food Insecurity:   . Worried About Charity fundraiser in the Last Year: Not on file  . Ran Out of Food in the Last Year: Not on file  Transportation Needs:   . Lack of Transportation (Medical): Not on file  . Lack of Transportation (Non-Medical): Not on file  Physical Activity:   . Days of Exercise per Week: Not on file  . Minutes of Exercise per Session: Not on file  Stress:   . Feeling of Stress : Not on file  Social Connections:   . Frequency of Communication with Friends and Family: Not on file  . Frequency of Social Gatherings with Friends and Family: Not on file  . Attends Religious Services: Not on file  . Active Member of Clubs or Organizations: Not on file  . Attends Archivist Meetings: Not on file  . Marital Status: Not on file  Intimate Partner Violence:   . Fear of Current or Ex-Partner: Not on file  .  Emotionally Abused: Not on file  . Physically Abused: Not on file  . Sexually Abused: Not on file    Past Surgical History:  Procedure Laterality Date  . BACK SURGERY  2000   Disc removal L4-L5  . CESAREAN SECTION    . COLPOSCOPY      Family History  Problem Relation Age of Onset  . Arthritis Mother   . Heart disease Mother   . Hyperlipidemia Mother   . Diabetes Father   . Factor V Leiden deficiency Father   . Muscular dystrophy Maternal Grandmother   . Heart disease Maternal Grandfather   . COPD Paternal Grandmother   . Breast cancer Neg Hx     No Known Allergies  Current Outpatient Medications on  File Prior to Visit  Medication Sig Dispense Refill  . acetaminophen (TYLENOL) 650 MG CR tablet Take 650 mg by mouth every 8 (eight) hours as needed for pain.    . Cetirizine HCl (ZYRTEC PO) Take by mouth.    . MELATONIN PO Take by mouth.    . rosuvastatin (CRESTOR) 10 MG tablet TAKE 1 TABLET (10 MG TOTAL) BY MOUTH EVERY EVENING. FOR CHOLESTEROL. 90 tablet 3  . SUMAtriptan (IMITREX) 25 MG tablet TAKE 1 TABLET BY MOUTH AT MIGRAINE ONSET, MAY REPEAT IN 2 HOURS IF HEADACHE PERSISTS OR RECURS. 10 tablet 0   No current facility-administered medications on file prior to visit.    BP 140/86   Pulse 84   Temp (!) 96.8 F (36 C) (Temporal)   Ht 5' 2.75" (1.594 m)   Wt 218 lb (98.9 kg)   SpO2 98%   BMI 38.93 kg/m    Objective:   Physical Exam  Constitutional: She appears well-nourished.  Cardiovascular: Normal rate.  Respiratory: Effort normal.  GI: Soft. Normal appearance and bowel sounds are normal. There is abdominal tenderness in the suprapubic area and left lower quadrant.    Skin: Skin is warm and dry.           Assessment & Plan:

## 2019-04-20 NOTE — Assessment & Plan Note (Signed)
No improvement in pain, now having LLQ pain in addition to pain to middle lower abdomen.  No prior history of diverticulitis.  Check labs including CMP, CBC with diff. Stat CT abdomen ordered. She appears stable for out patient treatment.

## 2019-06-11 ENCOUNTER — Other Ambulatory Visit: Payer: Self-pay | Admitting: Primary Care

## 2019-06-11 DIAGNOSIS — E785 Hyperlipidemia, unspecified: Secondary | ICD-10-CM

## 2019-06-23 ENCOUNTER — Other Ambulatory Visit: Payer: Self-pay

## 2019-06-25 ENCOUNTER — Other Ambulatory Visit (INDEPENDENT_AMBULATORY_CARE_PROVIDER_SITE_OTHER): Payer: BC Managed Care – PPO

## 2019-06-25 ENCOUNTER — Other Ambulatory Visit: Payer: Self-pay

## 2019-06-25 DIAGNOSIS — E785 Hyperlipidemia, unspecified: Secondary | ICD-10-CM

## 2019-06-25 LAB — HEPATIC FUNCTION PANEL
ALT: 20 U/L (ref 0–35)
AST: 16 U/L (ref 0–37)
Albumin: 4.2 g/dL (ref 3.5–5.2)
Alkaline Phosphatase: 64 U/L (ref 39–117)
Bilirubin, Direct: 0.1 mg/dL (ref 0.0–0.3)
Total Bilirubin: 0.4 mg/dL (ref 0.2–1.2)
Total Protein: 6.6 g/dL (ref 6.0–8.3)

## 2019-06-25 LAB — LIPID PANEL
Cholesterol: 176 mg/dL (ref 0–200)
HDL: 42.6 mg/dL (ref >39.00)
NonHDL: 133.87
Total CHOL/HDL Ratio: 4
Triglycerides: 212 mg/dL — ABNORMAL HIGH (ref 0.0–149.0)
VLDL: 42.4 mg/dL — ABNORMAL HIGH (ref 0.0–40.0)

## 2019-06-25 LAB — LDL CHOLESTEROL, DIRECT: Direct LDL: 98 mg/dL

## 2019-07-22 DIAGNOSIS — G4733 Obstructive sleep apnea (adult) (pediatric): Secondary | ICD-10-CM | POA: Diagnosis not present

## 2019-11-05 ENCOUNTER — Ambulatory Visit: Payer: BC Managed Care – PPO | Admitting: Primary Care

## 2019-11-11 ENCOUNTER — Ambulatory Visit: Payer: BC Managed Care – PPO | Admitting: Primary Care

## 2019-11-17 ENCOUNTER — Ambulatory Visit: Payer: BC Managed Care – PPO | Admitting: Primary Care

## 2019-11-18 ENCOUNTER — Ambulatory Visit: Payer: BC Managed Care – PPO | Admitting: Primary Care

## 2019-12-27 ENCOUNTER — Ambulatory Visit: Payer: BC Managed Care – PPO | Admitting: Primary Care

## 2019-12-30 ENCOUNTER — Other Ambulatory Visit: Payer: Self-pay

## 2019-12-30 ENCOUNTER — Encounter: Payer: Self-pay | Admitting: Primary Care

## 2019-12-30 ENCOUNTER — Ambulatory Visit: Payer: BC Managed Care – PPO | Admitting: Primary Care

## 2019-12-30 ENCOUNTER — Other Ambulatory Visit (HOSPITAL_COMMUNITY)
Admission: RE | Admit: 2019-12-30 | Discharge: 2019-12-30 | Disposition: A | Discharge status: Home / Self Care | Payer: BC Managed Care – PPO | Source: Ambulatory Visit | Attending: Primary Care | Admitting: Primary Care

## 2019-12-30 VITALS — BP 132/82 | HR 100 | Temp 97.6°F | Ht 62.4 in | Wt 212.0 lb

## 2019-12-30 DIAGNOSIS — R87612 Low grade squamous intraepithelial lesion on cytologic smear of cervix (LGSIL): Secondary | ICD-10-CM

## 2019-12-30 DIAGNOSIS — D6851 Activated protein C resistance: Secondary | ICD-10-CM

## 2019-12-30 DIAGNOSIS — Z0001 Encounter for general adult medical examination with abnormal findings: Secondary | ICD-10-CM

## 2019-12-30 DIAGNOSIS — Z124 Encounter for screening for malignant neoplasm of cervix: Secondary | ICD-10-CM

## 2019-12-30 DIAGNOSIS — R87622 Low grade squamous intraepithelial lesion on cytologic smear of vagina (LGSIL): Secondary | ICD-10-CM

## 2019-12-30 DIAGNOSIS — Z114 Encounter for screening for human immunodeficiency virus [HIV]: Secondary | ICD-10-CM | POA: Diagnosis not present

## 2019-12-30 DIAGNOSIS — G5603 Carpal tunnel syndrome, bilateral upper limbs: Secondary | ICD-10-CM

## 2019-12-30 DIAGNOSIS — E785 Hyperlipidemia, unspecified: Secondary | ICD-10-CM | POA: Diagnosis not present

## 2019-12-30 DIAGNOSIS — Z1159 Encounter for screening for other viral diseases: Secondary | ICD-10-CM

## 2019-12-30 DIAGNOSIS — R7303 Prediabetes: Secondary | ICD-10-CM | POA: Diagnosis not present

## 2019-12-30 DIAGNOSIS — Z1231 Encounter for screening mammogram for malignant neoplasm of breast: Secondary | ICD-10-CM | POA: Diagnosis not present

## 2019-12-30 DIAGNOSIS — G4730 Sleep apnea, unspecified: Secondary | ICD-10-CM

## 2019-12-30 DIAGNOSIS — G43709 Chronic migraine without aura, not intractable, without status migrainosus: Secondary | ICD-10-CM

## 2019-12-30 DIAGNOSIS — L989 Disorder of the skin and subcutaneous tissue, unspecified: Secondary | ICD-10-CM

## 2019-12-30 DIAGNOSIS — Z0000 Encounter for general adult medical examination without abnormal findings: Secondary | ICD-10-CM | POA: Insufficient documentation

## 2019-12-30 LAB — CBC
HCT: 45.9 % (ref 36.0–46.0)
Hemoglobin: 15.7 g/dL — ABNORMAL HIGH (ref 12.0–15.0)
MCHC: 34.1 g/dL (ref 30.0–36.0)
MCV: 90.6 fl (ref 78.0–100.0)
Platelets: 271 10*3/uL (ref 150.0–400.0)
RBC: 5.07 Mil/uL (ref 3.87–5.11)
RDW: 12.9 % (ref 11.5–15.5)
WBC: 11.4 10*3/uL — ABNORMAL HIGH (ref 4.0–10.5)

## 2019-12-30 LAB — LIPID PANEL
Cholesterol: 183 mg/dL (ref 0–200)
HDL: 58.9 mg/dL (ref >39.00)
LDL Cholesterol: 95 mg/dL (ref 0–99)
NonHDL: 124.12
Total CHOL/HDL Ratio: 3
Triglycerides: 145 mg/dL (ref 0.0–149.0)
VLDL: 29 mg/dL (ref 0.0–40.0)

## 2019-12-30 LAB — COMPREHENSIVE METABOLIC PANEL
ALT: 23 U/L (ref 0–35)
AST: 20 U/L (ref 0–37)
Albumin: 4.6 g/dL (ref 3.5–5.2)
Alkaline Phosphatase: 64 U/L (ref 39–117)
BUN: 14 mg/dL (ref 6–23)
CO2: 28 mEq/L (ref 19–32)
Calcium: 9.6 mg/dL (ref 8.4–10.5)
Chloride: 103 mEq/L (ref 96–112)
Creatinine, Ser: 0.86 mg/dL (ref 0.40–1.20)
GFR: 75.87 mL/min (ref >60.00)
Glucose, Bld: 95 mg/dL (ref 70–99)
Potassium: 4 mEq/L (ref 3.5–5.1)
Sodium: 139 mEq/L (ref 135–145)
Total Bilirubin: 0.5 mg/dL (ref 0.2–1.2)
Total Protein: 7.4 g/dL (ref 6.0–8.3)

## 2019-12-30 LAB — HEMOGLOBIN A1C: Hgb A1c MFr Bld: 5.7 % (ref 4.6–6.5)

## 2019-12-30 NOTE — Assessment & Plan Note (Signed)
Continued, never followed up with orthopedics from last year. New referral placed given increased symptoms to right upper extremity.

## 2019-12-30 NOTE — Assessment & Plan Note (Signed)
Repeat pap smear pending.

## 2019-12-30 NOTE — Progress Notes (Addendum)
Subjective:    Patient ID: Angela Gregory, female    DOB: 1965-04-22, 54 y.o.   MRN: [id number]  HPI  This visit occurred during the SARS-CoV-2 public health emergency.  Safety protocols were in place, including screening questions prior to the visit, additional usage of staff PPE, and extensive cleaning of exam room while observing appropriate contact time as indicated for disinfecting solutions.   [name] is a 54 year old female who presents today for complete physical.  Immunizations: -Tetanus: Unsure, due today -Influenza: Due today -Shingles: Never completed  -Covid-19: Never completed  Diet: She endorses a healthy diet.  Exercise: She was exercising several times weekly  Eye exam: Completes annually  Dental exam: Completes annually   Pap Smear: Completed in 2020, due today given HPV detection  Mammogram: Due Colonoscopy: Completed in 2017, due in 2022 Hep C Screen: Due  BP Readings from Last 3 Encounters:  12/30/19 132/82  04/20/19 140/86  04/19/19 (!) 141/85     Review of Systems  Constitutional: Negative for unexpected weight change.  HENT: Positive for tinnitus. Negative for rhinorrhea.   Respiratory: Negative for cough and shortness of breath.   Cardiovascular: Negative for chest pain.  Gastrointestinal: Negative for constipation and diarrhea.  Genitourinary: Negative for difficulty urinating.  Musculoskeletal: Positive for arthralgias. Negative for myalgias.  Skin: Negative for rash.       Skin mass to posterior neck that she's noticed for years.  Allergic/Immunologic: Positive for environmental allergies.  Neurological: Positive for numbness. Negative for dizziness and headaches.       Radiation of numbness from right wrist up to to mid upper arm, was never evaluated by orthopedics last year  Psychiatric/Behavioral: The patient is not nervous/anxious.        Past Medical History:  Diagnosis Date  . Abnormal Pap smear of cervix   . Arthritis     Neck, hands  . Carpal tunnel syndrome   . Chickenpox   . Diverticulosis   . Frequent headaches   . Hyperlipidemia      Social History   Socioeconomic History  . Marital status: Divorced    Spouse name: Not on file  . Number of children: Not on file  . Years of education: Not on file  . Highest education level: Not on file  Occupational History  . Not on file  Tobacco Use  . Smoking status: Never Smoker  . Smokeless tobacco: Never Used  Vaping Use  . Vaping Use: Never used  Substance and Sexual Activity  . Alcohol use: Yes  . Drug use: Never  . Sexual activity: Yes  Other Topics Concern  . Not on file  Social History Narrative   Divorced.   2 children.   Works in Audiological scientist.   Social Determinants of Health   Financial Resource Strain:   . Difficulty of Paying Living Expenses: Not on file  Food Insecurity:   . Worried About Programme researcher, broadcasting/film/video in the Last Year: Not on file  . Ran Out of Food in the Last Year: Not on file  Transportation Needs:   . Lack of Transportation (Medical): Not on file  . Lack of Transportation (Non-Medical): Not on file  Physical Activity:   . Days of Exercise per Week: Not on file  . Minutes of Exercise per Session: Not on file  Stress:   . Feeling of Stress : Not on file  Social Connections:   . Frequency of Communication with Friends and Family: Not on  file  . Frequency of Social Gatherings with Friends and Family: Not on file  . Attends Religious Services: Not on file  . Active Member of Clubs or Organizations: Not on file  . Attends Banker Meetings: Not on file  . Marital Status: Not on file  Intimate Partner Violence:   . Fear of Current or Ex-Partner: Not on file  . Emotionally Abused: Not on file  . Physically Abused: Not on file  . Sexually Abused: Not on file    Past Surgical History:  Procedure Laterality Date  . BACK SURGERY  2000   Disc removal L4-L5  . CESAREAN SECTION    . COLPOSCOPY      Family  History  Problem Relation Age of Onset  . Arthritis Mother   . Heart disease Mother   . Hyperlipidemia Mother   . Diabetes Father   . Factor V Leiden deficiency Father   . Muscular dystrophy Maternal Grandmother   . Heart disease Maternal Grandfather   . COPD Paternal Grandmother   . Breast cancer Neg Hx     No Known Allergies  Current Outpatient Medications on File Prior to Visit  Medication Sig Dispense Refill  . acetaminophen (TYLENOL) 650 MG CR tablet Take 650 mg by mouth every 8 (eight) hours as needed for pain.    . Cetirizine HCl (ZYRTEC PO) Take by mouth.    . MELATONIN PO Take by mouth.    . rosuvastatin (CRESTOR) 10 MG tablet TAKE 1 TABLET (10 MG TOTAL) BY MOUTH EVERY EVENING. FOR CHOLESTEROL. 90 tablet 3  . SUMAtriptan (IMITREX) 25 MG tablet TAKE 1 TABLET BY MOUTH AT MIGRAINE ONSET, MAY REPEAT IN 2 HOURS IF HEADACHE PERSISTS OR RECURS. 10 tablet 0   No current facility-administered medications on file prior to visit.    BP 132/82   Pulse 100   Temp 97.6 F (36.4 C) (Temporal)   Ht 5' 2.4" (1.585 m)   Wt 212 lb (96.2 kg)   SpO2 97%   BMI 38.28 kg/m    Objective:   Physical Exam HENT:     Right Ear: Tympanic membrane and ear canal normal.     Left Ear: Tympanic membrane and ear canal normal.  Eyes:     Pupils: Pupils are equal, round, and reactive to light.  Cardiovascular:     Rate and Rhythm: Normal rate and regular rhythm.  Pulmonary:     Effort: Pulmonary effort is normal.     Breath sounds: Normal breath sounds.  Chest:     Breasts: Breasts are symmetrical.        Right: Normal. No mass or skin change.        Left: Normal. No mass or skin change.  Abdominal:     General: Bowel sounds are normal.     Palpations: Abdomen is soft.     Tenderness: There is no abdominal tenderness.  Genitourinary:    Labia:        Right: No tenderness or lesion.        Left: No tenderness or lesion.      Vagina: Normal.     Cervix: Normal.     Uterus: Normal.        Adnexa: Right adnexa normal.  Musculoskeletal:        General: Normal range of motion.     Cervical back: Neck supple.  Skin:    General: Skin is warm and dry.     Comments: Raised, crater appearing  skin lesion to posterior neck. Flesh colored. Non tender.  Neurological:     Mental Status: She is alert and oriented to person, place, and time.     Cranial Nerves: No cranial nerve deficit.     Deep Tendon Reflexes:     Reflex Scores:      Patellar reflexes are 2+ on the right side and 2+ on the left side. Psychiatric:        Mood and Affect: Mood normal.            Assessment & Plan:

## 2019-12-30 NOTE — Assessment & Plan Note (Signed)
Appears suspicious for skin cancer. Referral placed to dermatology

## 2019-12-30 NOTE — Assessment & Plan Note (Signed)
Shingrix due, she will schedule nurse visit. Tdap and flu provided today. Pap smear due, completed today. Mammogram due, ordered. Colonoscopy UTD, due in 2022.  Discussed the importance of a healthy diet and regular exercise in order for weight loss, and to reduce the risk of any potential medical problems.  Exam today as noted. Labs pending

## 2019-12-30 NOTE — Assessment & Plan Note (Signed)
Diagnosed last year per hematology.

## 2019-12-30 NOTE — Patient Instructions (Addendum)
Stop by the lab prior to leaving today. I will notify you of your results once received.   [facility] to schedule your [facility].   Schedule a nurse visit for the Shingles vaccine series.  Start exercising. You should be getting 150 minutes of moderate intensity exercise weekly.  Continue to work on a healthy diet.  It was a pleasure to see you today!   Preventive Care 30-55 Years Old, Female Preventive care refers to visits with your health care provider and lifestyle choices that can promote health and wellness. This includes:  A yearly physical exam. This may also be called an annual well check.  Regular dental visits and eye exams.  Immunizations.  Screening for certain conditions.  Healthy lifestyle choices, such as eating a healthy diet, getting regular exercise, not using drugs or products that contain nicotine and tobacco, and limiting alcohol use. What can I expect for my preventive care visit? Physical exam Your health care provider will check your:  Height and weight. This may be used to calculate body mass index (BMI), which tells if you are at a healthy weight.  Heart rate and blood pressure.  Skin for abnormal spots. Counseling Your health care provider may ask you questions about your:  Alcohol, tobacco, and drug use.  Emotional well-being.  Home and relationship well-being.  Sexual activity.  Eating habits.  Work and work Statistician.  Method of birth control.  Menstrual cycle.  Pregnancy history. What immunizations do I need?  Influenza (flu) vaccine  This is recommended every year. Tetanus, diphtheria, and pertussis (Tdap) vaccine  You may need a Td booster every 10 years. Varicella (chickenpox) vaccine  You may need this if you have not been vaccinated. Zoster (shingles) vaccine  You may need this after age 70. Measles, mumps, and rubella (MMR) vaccine  You may need at least one dose of MMR if you were born in 1957  or later. You may also need a second dose. Pneumococcal conjugate (PCV13) vaccine  You may need this if you have certain conditions and were not previously vaccinated. Pneumococcal polysaccharide (PPSV23) vaccine  You may need one or two doses if you smoke cigarettes or if you have certain conditions. Meningococcal conjugate (MenACWY) vaccine  You may need this if you have certain conditions. Hepatitis A vaccine  You may need this if you have certain conditions or if you travel or work in places where you may be exposed to hepatitis A. Hepatitis B vaccine  You may need this if you have certain conditions or if you travel or work in places where you may be exposed to hepatitis B. Haemophilus influenzae type b (Hib) vaccine  You may need this if you have certain conditions. Human papillomavirus (HPV) vaccine  If recommended by your health care provider, you may need three doses over 6 months. You may receive vaccines as individual doses or as more than one vaccine together in one shot (combination vaccines). Talk with your health care provider about the risks and benefits of combination vaccines. What tests do I need? Blood tests  Lipid and cholesterol levels. These may be checked every 5 years, or more frequently if you are over 24 years old.  Hepatitis C test.  Hepatitis B test. Screening  Lung cancer screening. You may have this screening every year starting at age 47 if you have a 30-pack-year history of smoking and currently smoke or have quit within the past 15 years.  Colorectal cancer screening. All adults  should have this screening starting at age 62 and continuing until age 9. Your health care provider may recommend screening at age 8 if you are at increased risk. You will have tests every 1-10 years, depending on your results and the type of screening test.  Diabetes screening. This is done by checking your blood sugar (glucose) after you have not eaten for a while  (fasting). You may have this done every 1-3 years.  Mammogram. This may be done every 1-2 years. Talk with your health care provider about when you should start having regular mammograms. This may depend on whether you have a family history of breast cancer.  BRCA-related cancer screening. This may be done if you have a family history of breast, ovarian, tubal, or peritoneal cancers.  Pelvic exam and Pap test. This may be done every 3 years starting at age 15. Starting at age 71, this may be done every 5 years if you have a Pap test in combination with an HPV test. Other tests  Sexually transmitted disease (STD) testing.  Bone density scan. This is done to screen for osteoporosis. You may have this scan if you are at high risk for osteoporosis. Follow these instructions at home: Eating and drinking  Eat a diet that includes fresh fruits and vegetables, whole grains, lean protein, and low-fat dairy.  Take vitamin and mineral supplements as recommended by your health care provider.  Do not drink alcohol if: ? Your health care provider tells you not to drink. ? You are pregnant, may be pregnant, or are planning to become pregnant.  If you drink alcohol: ? Limit how much you have to 0-1 drink a day. ? Be aware of how much alcohol is in your drink. In the [place] one drink equals one 12 oz bottle of beer (355 mL), one 5 oz glass of wine (148 mL), or one 1 oz glass of hard liquor (44 mL). Lifestyle  Take daily care of your teeth and gums.  Stay active. Exercise for at least 30 minutes on 5 or more days each week.  Do not use any products that contain nicotine or tobacco, such as cigarettes, e-cigarettes, and chewing tobacco. If you need help quitting, ask your health care provider.  If you are sexually active, practice safe sex. Use a condom or other form of birth control (contraception) in order to prevent pregnancy and STIs (sexually transmitted infections).  If told by your health  care provider, take low-dose aspirin daily starting at age 23. What's next?  Visit your health care provider once a year for a well check visit.  Ask your health care provider how often you should have your eyes and teeth checked.  Stay up to date on all vaccines. This information is not intended to replace advice given to you by your health care provider. Make sure you discuss any questions you have with your health care provider. Document Revised: 11/13/2017 Document Reviewed: 11/13/2017 Elsevier Patient Education  [place].   Influenza (Flu) Vaccine (Inactivated or Recombinant): What You Need to Know 1. Why get vaccinated? Influenza vaccine can prevent influenza (flu). Flu is a contagious disease that spreads around the [place] every year, usually between October and May. Anyone can get the flu, but it is more dangerous for some people. Infants and young children, people 32 years of age and older, pregnant women, and people with certain health conditions or a weakened immune system are at greatest risk of flu complications. Pneumonia, bronchitis,  sinus infections and ear infections are examples of flu-related complications. If you have a medical condition, such as heart disease, cancer or diabetes, flu can make it worse. Flu can cause fever and chills, sore throat, muscle aches, fatigue, cough, headache, and runny or stuffy nose. Some people may have vomiting and diarrhea, though this is more common in children than adults. Each year thousands of people in the [place] die from flu, and many more are hospitalized. Flu vaccine prevents millions of illnesses and flu-related visits to the doctor each year. 2. Influenza vaccine CDC recommends everyone 87 months of age and older get vaccinated every flu season. Children 6 months through 71 years of age may need 2 doses during a single flu season. Everyone else needs only 1 dose each flu season. It takes about 2 weeks for  protection to develop after vaccination. There are many flu viruses, and they are always changing. Each year a new flu vaccine is made to protect against three or four viruses that are likely to cause disease in the upcoming flu season. Even when the vaccine doesn't exactly match these viruses, it may still provide some protection. Influenza vaccine does not cause flu. Influenza vaccine may be given at the same time as other vaccines. 3. Talk with your health care provider Tell your vaccine provider if the person getting the vaccine:  Has had an allergic reaction after a previous dose of influenza vaccine, or has any severe, life-threatening allergies.  Has ever had Guillain-Barr Syndrome (also called GBS). In some cases, your health care provider may decide to postpone influenza vaccination to a future visit. People with minor illnesses, such as a cold, may be vaccinated. People who are moderately or severely ill should usually wait until they recover before getting influenza vaccine. Your health care provider can give you more information. 4. Risks of a vaccine reaction  Soreness, redness, and swelling where shot is given, fever, muscle aches, and headache can happen after influenza vaccine.  There may be a very small increased risk of Guillain-Barr Syndrome (GBS) after inactivated influenza vaccine (the flu shot). Young children who get the flu shot along with pneumococcal vaccine (PCV13), and/or DTaP vaccine at the same time might be slightly more likely to have a seizure caused by fever. Tell your health care provider if a child who is getting flu vaccine has ever had a seizure. People sometimes faint after medical procedures, including vaccination. Tell your provider if you feel dizzy or have vision changes or ringing in the ears. As with any medicine, there is a very remote chance of a vaccine causing a severe allergic reaction, other serious injury, or death. 5. What if there is a  serious problem? An allergic reaction could occur after the vaccinated person leaves the clinic. If you see signs of a severe allergic reaction (hives, swelling of the face and throat, difficulty breathing, a fast heartbeat, dizziness, or weakness), call 9-1-1 and get the person to the nearest hospital. For other signs that concern you, call your health care provider. Adverse reactions should be reported to the Vaccine Adverse Event Reporting System (VAERS). Your health care provider will usually file this report, or you can do it yourself. Visit the VAERS website at www.vaers.SamedayNews.es or call 405-665-9462.VAERS is only for reporting reactions, and VAERS staff do not give medical advice. 6. The National Vaccine Injury Compensation Program The National Vaccine Injury Compensation Program (VICP) is a federal program that was created to compensate people who may have  been injured by certain vaccines. Visit the VICP website at GoldCloset.com.ee or call 573 164 9553 to learn about the program and about filing a claim. There is a time limit to file a claim for compensation. 7. How can I learn more?  Ask your healthcare provider.  Call your local or state health department.  Contact the Centers for Disease Control and Prevention (CDC): ? Call (928)431-1352 (1-800-CDC-INFO) or ? Visit CDC's https://gibson.com/ Vaccine Information Statement (Interim) Inactivated Influenza Vaccine (10/30/2017) This information is not intended to replace advice given to you by your health care provider. Make sure you discuss any questions you have with your health care provider. Document Revised: 06/23/2018 Document Reviewed: 11/03/2017 Elsevier Patient Education  [place].    Influenza (Flu) Vaccine (Inactivated or Recombinant): What You Need to Know 1. Why get vaccinated? Influenza vaccine can prevent influenza (flu). Flu is a contagious disease that spreads around the [place] every  year, usually between October and May. Anyone can get the flu, but it is more dangerous for some people. Infants and young children, people 41 years of age and older, pregnant women, and people with certain health conditions or a weakened immune system are at greatest risk of flu complications. Pneumonia, bronchitis, sinus infections and ear infections are examples of flu-related complications. If you have a medical condition, such as heart disease, cancer or diabetes, flu can make it worse. Flu can cause fever and chills, sore throat, muscle aches, fatigue, cough, headache, and runny or stuffy nose. Some people may have vomiting and diarrhea, though this is more common in children than adults. Each year thousands of people in the [place] die from flu, and many more are hospitalized. Flu vaccine prevents millions of illnesses and flu-related visits to the doctor each year. 2. Influenza vaccine CDC recommends everyone 65 months of age and older get vaccinated every flu season. Children 6 months through 26 years of age may need 2 doses during a single flu season. Everyone else needs only 1 dose each flu season. It takes about 2 weeks for protection to develop after vaccination. There are many flu viruses, and they are always changing. Each year a new flu vaccine is made to protect against three or four viruses that are likely to cause disease in the upcoming flu season. Even when the vaccine doesn't exactly match these viruses, it may still provide some protection. Influenza vaccine does not cause flu. Influenza vaccine may be given at the same time as other vaccines. 3. Talk with your health care provider Tell your vaccine provider if the person getting the vaccine:  Has had an allergic reaction after a previous dose of influenza vaccine, or has any severe, life-threatening allergies.  Has ever had Guillain-Barr Syndrome (also called GBS). In some cases, your health care provider may decide to  postpone influenza vaccination to a future visit. People with minor illnesses, such as a cold, may be vaccinated. People who are moderately or severely ill should usually wait until they recover before getting influenza vaccine. Your health care provider can give you more information. 4. Risks of a vaccine reaction  Soreness, redness, and swelling where shot is given, fever, muscle aches, and headache can happen after influenza vaccine.  There may be a very small increased risk of Guillain-Barr Syndrome (GBS) after inactivated influenza vaccine (the flu shot). Young children who get the flu shot along with pneumococcal vaccine (PCV13), and/or DTaP vaccine at the same time might be slightly more likely to have a  seizure caused by fever. Tell your health care provider if a child who is getting flu vaccine has ever had a seizure. People sometimes faint after medical procedures, including vaccination. Tell your provider if you feel dizzy or have vision changes or ringing in the ears. As with any medicine, there is a very remote chance of a vaccine causing a severe allergic reaction, other serious injury, or death. 5. What if there is a serious problem? An allergic reaction could occur after the vaccinated person leaves the clinic. If you see signs of a severe allergic reaction (hives, swelling of the face and throat, difficulty breathing, a fast heartbeat, dizziness, or weakness), call 9-1-1 and get the person to the nearest hospital. For other signs that concern you, call your health care provider. Adverse reactions should be reported to the Vaccine Adverse Event Reporting System (VAERS). Your health care provider will usually file this report, or you can do it yourself. Visit the VAERS website at www.vaers.SamedayNews.es or call 910-605-0338.VAERS is only for reporting reactions, and VAERS staff do not give medical advice. 6. The National Vaccine Injury Compensation Program The [employer] Vaccine Injury  Compensation Program (VICP) is a federal program that was created to compensate people who may have been injured by certain vaccines. Visit the VICP website at GoldCloset.com.ee or call (903)120-1352 to learn about the program and about filing a claim. There is a time limit to file a claim for compensation. 7. How can I learn more?  Ask your healthcare provider.  Call your local or state health department.  Contact the Centers for Disease Control and Prevention (CDC): ? Call 907-250-2225 (1-800-CDC-INFO) or ? Visit CDC's https://gibson.com/ Vaccine Information Statement (Interim) Inactivated Influenza Vaccine (10/30/2017) This information is not intended to replace advice given to you by your health care provider. Make sure you discuss any questions you have with your health care provider. Document Revised: 06/23/2018 Document Reviewed: 11/03/2017 Elsevier Patient Education  [place].

## 2019-12-30 NOTE — Assessment & Plan Note (Signed)
Noted on labs from last year.  Repeat A1C pending.

## 2019-12-30 NOTE — Assessment & Plan Note (Signed)
Overall infrequent migraines, sumatriptan effective. Continue to monitor.

## 2019-12-30 NOTE — Assessment & Plan Note (Signed)
Compliant to Crestor, repeat lipids pending. 

## 2019-12-30 NOTE — Assessment & Plan Note (Signed)
Compliant to CPAP machine nightly, never followed up with pulmonology last year. New referral placed.

## 2019-12-31 ENCOUNTER — Encounter: Payer: Self-pay | Admitting: Hematology and Oncology

## 2019-12-31 LAB — HIV ANTIBODY (ROUTINE TESTING W REFLEX): HIV 1&2 Ab, 4th Generation: NONREACTIVE

## 2019-12-31 LAB — HEPATITIS C ANTIBODY
Hepatitis C Ab: NONREACTIVE
SIGNAL TO CUT-OFF: 0.01 (ref <1.00)

## 2020-01-03 ENCOUNTER — Telehealth: Payer: Self-pay

## 2020-01-03 ENCOUNTER — Encounter: Payer: Self-pay | Admitting: Hematology and Oncology

## 2020-01-03 NOTE — Telephone Encounter (Signed)
Per Dr Merlene Pulling, she has reviewed the labs the WBC is mildly elevated. No differential provided. Etiology unclear. Hemoglobin slightly up, but not> = 16.0. If any symptoms or concerns then you can be seen for re-evaluation.

## 2020-01-06 DIAGNOSIS — Z1211 Encounter for screening for malignant neoplasm of colon: Secondary | ICD-10-CM

## 2020-01-06 DIAGNOSIS — L820 Inflamed seborrheic keratosis: Secondary | ICD-10-CM | POA: Diagnosis not present

## 2020-01-06 LAB — CYTOLOGY - PAP
Adequacy: ABSENT
Comment: NEGATIVE
Comment: NEGATIVE
Diagnosis: NEGATIVE
HPV 16: NEGATIVE
HPV 18 / 45: NEGATIVE
High risk HPV: POSITIVE — AB

## 2020-01-10 DIAGNOSIS — G5601 Carpal tunnel syndrome, right upper limb: Secondary | ICD-10-CM | POA: Diagnosis not present

## 2020-01-31 DIAGNOSIS — R200 Anesthesia of skin: Secondary | ICD-10-CM | POA: Diagnosis not present

## 2020-02-09 DIAGNOSIS — R200 Anesthesia of skin: Secondary | ICD-10-CM | POA: Insufficient documentation

## 2020-02-15 ENCOUNTER — Other Ambulatory Visit: Payer: Self-pay

## 2020-02-15 ENCOUNTER — Ambulatory Visit
Admission: RE | Admit: 2020-02-15 | Discharge: 2020-02-15 | Disposition: A | Discharge status: Home / Self Care | Payer: BC Managed Care – PPO | Source: Ambulatory Visit | Attending: Primary Care | Admitting: Primary Care

## 2020-02-15 DIAGNOSIS — Z1231 Encounter for screening mammogram for malignant neoplasm of breast: Secondary | ICD-10-CM | POA: Insufficient documentation

## 2020-03-01 ENCOUNTER — Other Ambulatory Visit: Payer: Self-pay | Admitting: Primary Care

## 2020-03-01 DIAGNOSIS — E785 Hyperlipidemia, unspecified: Secondary | ICD-10-CM

## 2020-03-02 ENCOUNTER — Institutional Professional Consult (permissible substitution): Payer: BC Managed Care – PPO | Admitting: Pulmonary Disease

## 2020-03-29 DIAGNOSIS — G5601 Carpal tunnel syndrome, right upper limb: Secondary | ICD-10-CM | POA: Diagnosis not present

## 2020-04-25 ENCOUNTER — Other Ambulatory Visit: Payer: Self-pay

## 2020-04-25 ENCOUNTER — Telehealth (INDEPENDENT_AMBULATORY_CARE_PROVIDER_SITE_OTHER): Payer: Self-pay | Admitting: Gastroenterology

## 2020-04-25 DIAGNOSIS — Z86010 Personal history of colonic polyps: Secondary | ICD-10-CM

## 2020-04-25 NOTE — Progress Notes (Signed)
Gastroenterology Pre-Procedure Review  Request Date: 10/20/20 Requesting Physician: Dr. Maximino Greenland  PATIENT REVIEW QUESTIONS: The patient responded to the following health history questions as indicated:    1. Are you having any GI issues? no 2. Do you have a personal history of Polyps? yes (11/15/15 pts colonoscopy was performed in [place]) 3. Do you have a family history of Colon Cancer or Polyps? no 4. Diabetes Mellitus? no 5. Joint replacements in the past 12 months?Carpal tunnel release surgery January 2021 6. Major health problems in the past 3 months?no 7. Any artificial heart valves, MVP, or defibrillator?no    MEDICATIONS & ALLERGIES:    Patient reports the following regarding taking any anticoagulation/antiplatelet therapy:   Plavix, Coumadin, Eliquis, Xarelto, Lovenox, Pradaxa, Brilinta, or Effient? no Aspirin? no  Patient confirms/reports the following medications:  Current Outpatient Medications  Medication Sig Dispense Refill  . acetaminophen (TYLENOL) 650 MG CR tablet Take 650 mg by mouth every 8 (eight) hours as needed for pain.    Marland Kitchen MELATONIN PO Take by mouth.    . rosuvastatin (CRESTOR) 10 MG tablet TAKE 1 TABLET (10 MG TOTAL) BY MOUTH EVERY EVENING. FOR CHOLESTEROL. 90 tablet 3  . SUMAtriptan (IMITREX) 25 MG tablet TAKE 1 TABLET BY MOUTH AT MIGRAINE ONSET, MAY REPEAT IN 2 HOURS IF HEADACHE PERSISTS OR RECURS. 10 tablet 0  . Cetirizine HCl (ZYRTEC PO) Take by mouth. (Patient not taking: No sig reported)     No current facility-administered medications for this visit.    Patient confirms/reports the following allergies:  No Known Allergies  No orders of the defined types were placed in this encounter.   AUTHORIZATION INFORMATION Primary Insurance: 1D#: Group #:  Secondary Insurance: 1D#: Group #:  SCHEDULE INFORMATION: Date: 10/20/20 Time: Location:[facility]

## 2020-05-31 ENCOUNTER — Institutional Professional Consult (permissible substitution): Payer: BC Managed Care – PPO | Admitting: Pulmonary Disease

## 2020-09-03 IMAGING — MG MM DIGITAL SCREENING BILAT W/ TOMO W/ CAD
8 series · 8 of 24 positions shown · non-contrast
Comparison: Previous exam(s).

CLINICAL DATA: Screening.

EXAM:
DIGITAL SCREENING BILATERAL MAMMOGRAM WITH TOMO AND CAD

[R CC synth-2D]
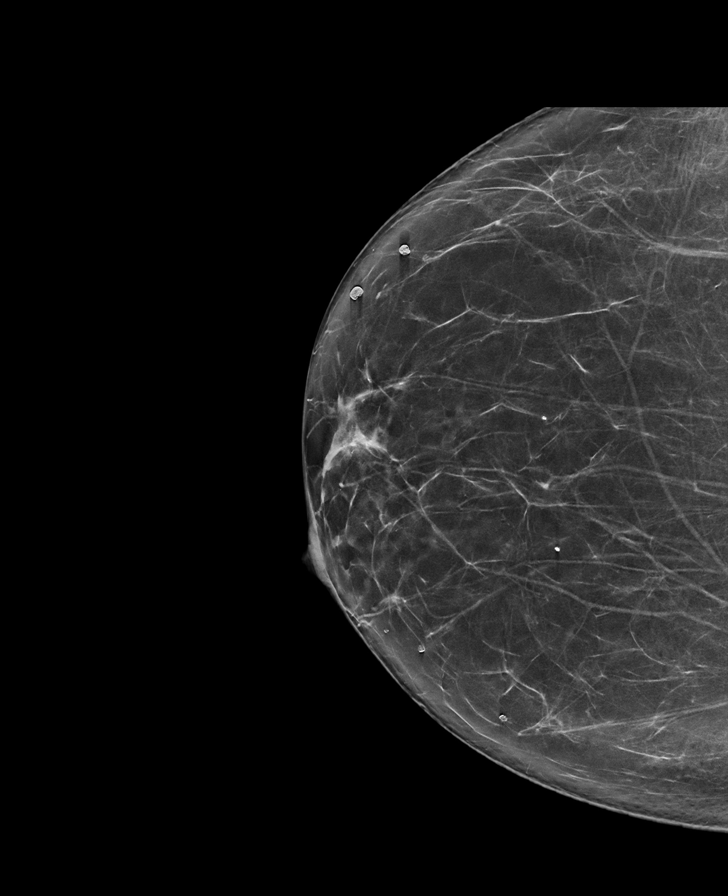

[L MLO synth-2D]
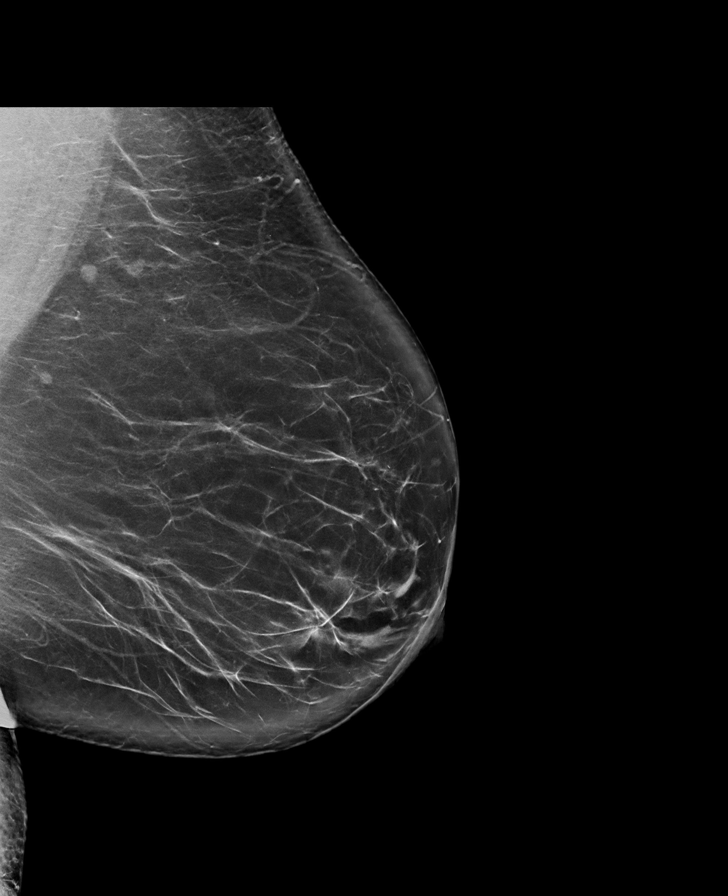

[R MLO synth-2D]
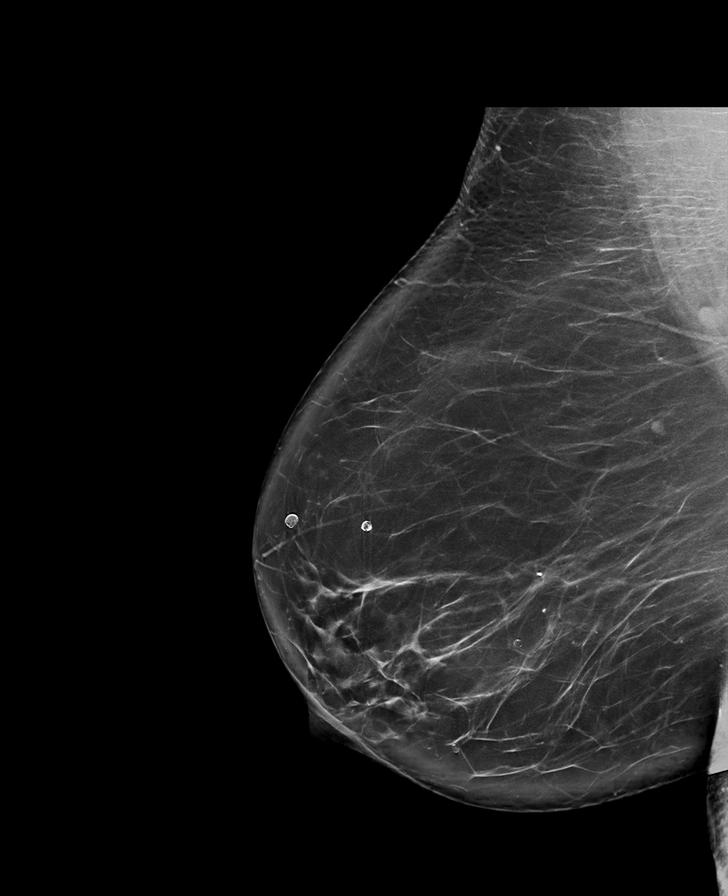

[L CC synth-2D]
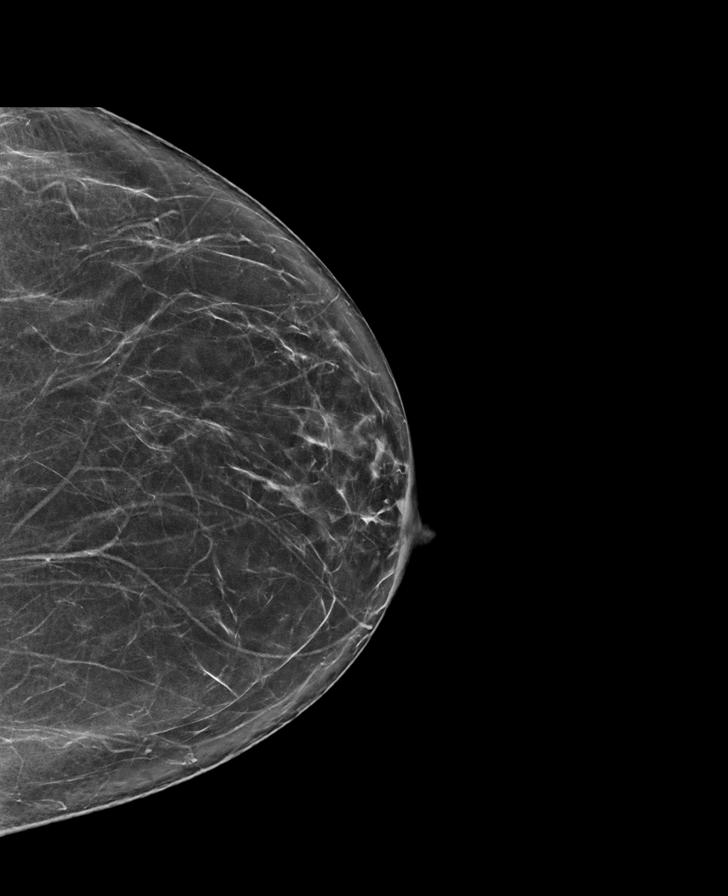

[L MLO tomo · tomo slice 49/98.0]
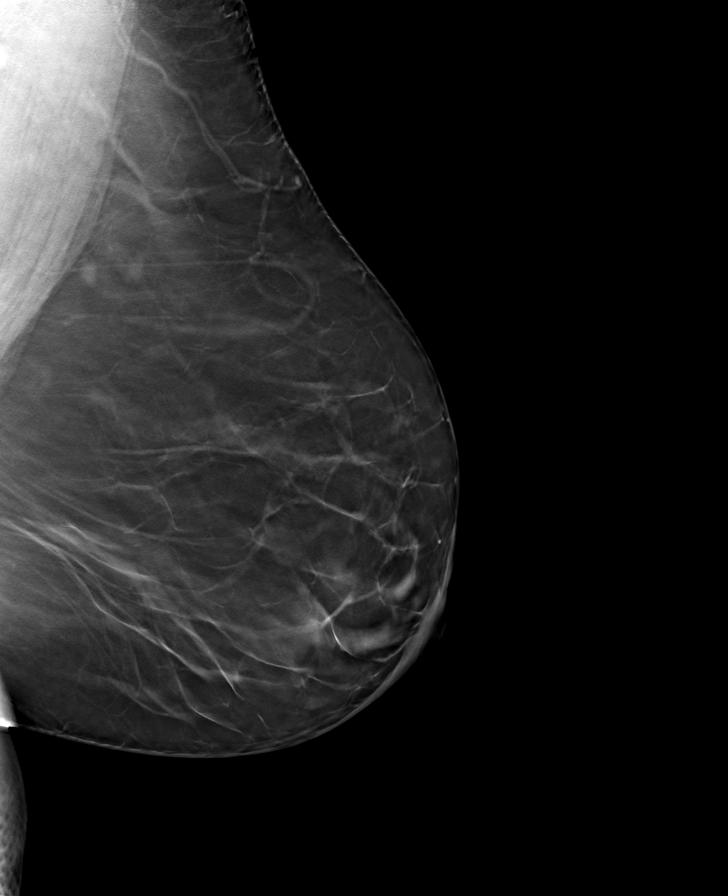

[R MLO tomo · tomo slice 49/98.0]
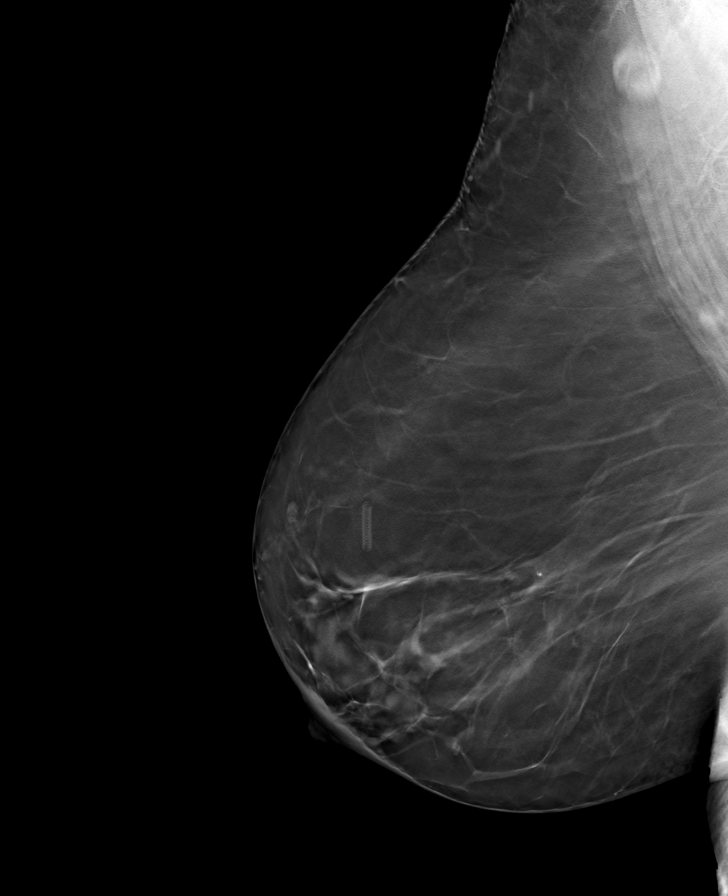

[L CC tomo · tomo slice 38/75.0]
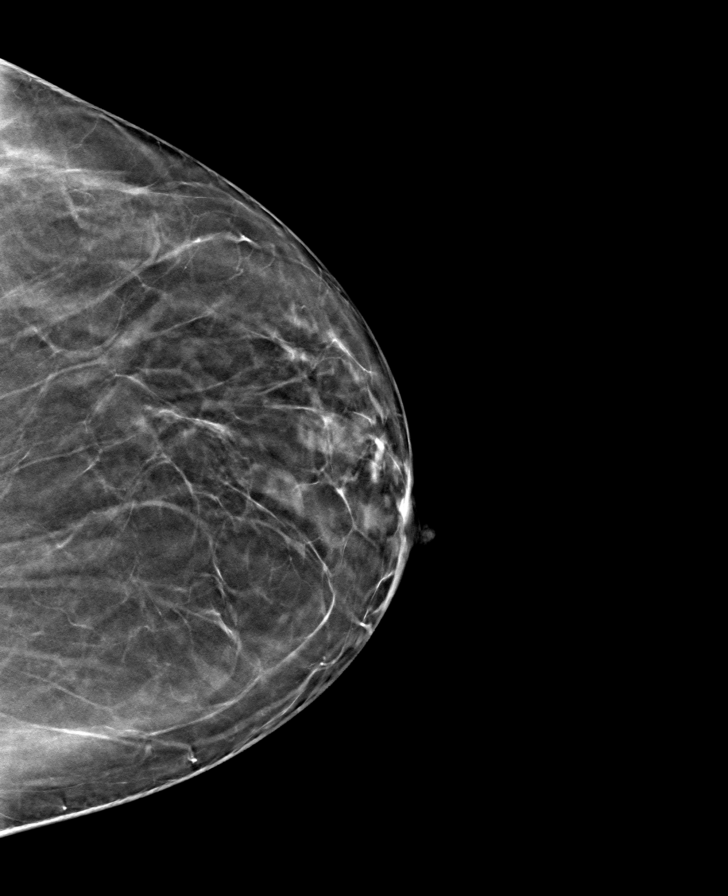

[R CC tomo · tomo slice 39/78.0]
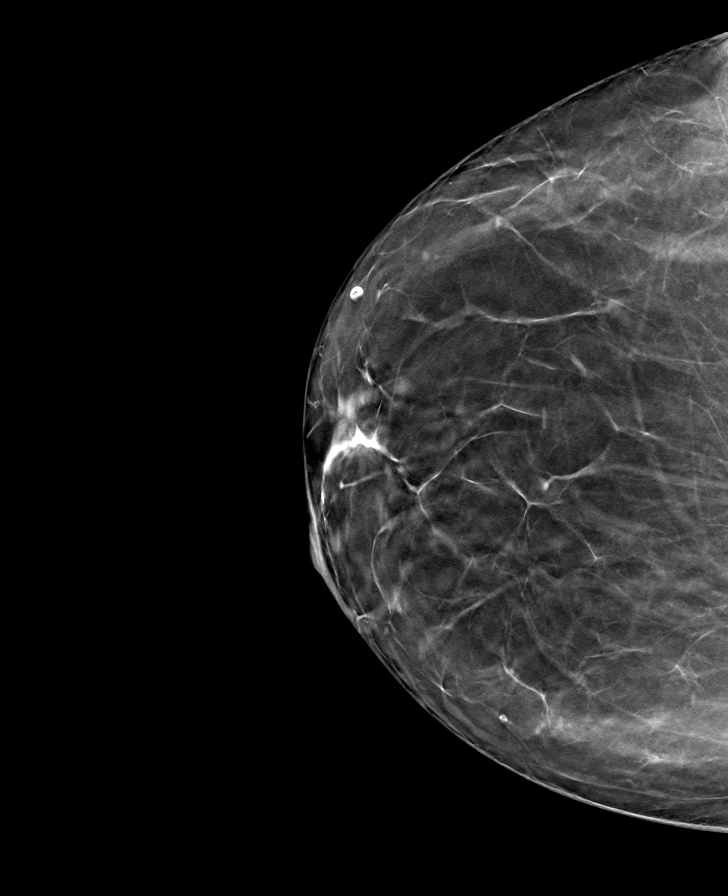

[8 of 24 positions shown; findings below may reference images not displayed]

ACR Breast Density Category b: There are scattered areas of
fibroglandular density.
FINDINGS: There are no findings suspicious for malignancy. Images were
processed with CAD.
IMPRESSION: No mammographic evidence of malignancy. A result letter of this
screening mammogram will be mailed directly to the patient.

RECOMMENDATION:
Screening mammogram in one year. (Code:[id number])

BI-RADS CATEGORY  1: Negative.

## 2020-09-16 ENCOUNTER — Telehealth: Payer: BC Managed Care – PPO | Admitting: Nurse Practitioner

## 2020-09-16 DIAGNOSIS — U071 COVID-19: Secondary | ICD-10-CM

## 2020-09-16 MED ORDER — MOLNUPIRAVIR EUA 200MG CAPSULE: 4.0000 | ORAL_CAPSULE | Freq: Two times a day (BID) | ORAL | 0 refills | Status: AC

## 2020-09-16 NOTE — Patient Instructions (Signed)
You are being prescribed MOLNUPIRAVIR for COVID-19 infection.     Please pick up your prescription at: [facility] [address] in [place]   Please call the pharmacy or go through the drive through vs going inside if you are picking up the mediation yourself to prevent further spread. If prescribed to a [facility] affiliated pharmacy, a pharmacist will bring the medication out to your car.   ADMINISTRATION INSTRUCTIONS: Take with or without food. Swallow the tablets whole. Don't chew, crush, or break the medications because it might not work as well  For each dose of the medication, you should be taking FOUR tablets at one time, TWICE a day   Finish your full five-day course of Molnupiravir even if you feel better before you're done. Stopping this medication too early can make it less effective to prevent severe illness related to COVID19.    Molnupiravir is prescribed for YOU ONLY. Don't share it with others, even if they have similar symptoms as you. This medication might not be right for everyone.   Make sure to take steps to protect yourself and others while you're taking this medication in order to get well soon and to prevent others from getting sick with COVID-19.   **If you are of childbearing potential (any gender) - it is advised to not get pregnant while taking this medication and recommended that condoms are used for female partners the next 3 months after taking the medication out of extreme caution    COMMON SIDE EFFECTS: Diarrhea Nausea  Dizziness    If your COVID-19 symptoms get worse, get medical help right away. Call 911 if you experience symptoms such as worsening cough, trouble breathing, chest pain that doesn't go away, confusion, a hard time staying awake, and pale or blue-colored skin. This medication won't prevent all COVID-19 cases from getting worse.      Person Under Monitoring Name: Angela Gregory  Location: [address] [place]  [address]   Infection Prevention Recommendations for Individuals Confirmed to have, or Being Evaluated for, 2019 Novel Coronavirus (COVID-19) Infection Who Receive Care at Home  Individuals who are confirmed to have, or are being evaluated for, COVID-19 should follow the prevention steps below until a healthcare provider or local or state health department says they can return to normal activities.  Stay home except to get medical care You should restrict activities outside your home, except for getting medical care. Do not go to work, school, or public areas, and do not use public transportation or taxis.  Call ahead before visiting your doctor Before your medical appointment, call the healthcare provider and tell them that you have, or are being evaluated for, COVID-19 infection. This will help the healthcare provider's office take steps to keep other people from getting infected. Ask your healthcare provider to call the local or state health department.  Monitor your symptoms Seek prompt medical attention if your illness is worsening (e.g., difficulty breathing). Before going to your medical appointment, call the healthcare provider and tell them that you have, or are being evaluated for, COVID-19 infection. Ask your healthcare provider to call the local or state health department.  Wear a facemask You should wear a facemask that covers your nose and mouth when you are in the same room with other people and when you visit a healthcare provider. People who live with or visit you should also wear a facemask while they are in the same room with you.  Separate yourself from other  people in your home As much as possible, you should stay in a different room from other people in your home. Also, you should use a separate bathroom, if available.  Avoid sharing household items You should not share dishes, drinking glasses, cups, eating utensils, towels, bedding, or other items with other  people in your home. After using these items, you should wash them thoroughly with soap and water.  Cover your coughs and sneezes Cover your mouth and nose with a tissue when you cough or sneeze, or you can cough or sneeze into your sleeve. Throw used tissues in a lined trash can, and immediately wash your hands with soap and water for at least 20 seconds or use an alcohol-based hand rub.  Wash your [employer] your hands often and thoroughly with soap and water for at least 20 seconds. You can use an alcohol-based hand sanitizer if soap and water are not available and if your hands are not visibly dirty. Avoid touching your eyes, nose, and mouth with unwashed hands.   Prevention Steps for Caregivers and Household Members of Individuals Confirmed to have, or Being Evaluated for, COVID-19 Infection Being Cared for in the Home  If you live with, or provide care at home for, a person confirmed to have, or being evaluated for, COVID-19 infection please follow these guidelines to prevent infection:  Follow healthcare provider's instructions Make sure that you understand and can help the patient follow any healthcare provider instructions for all care.  Provide for the patient's basic needs You should help the patient with basic needs in the home and provide support for getting groceries, prescriptions, and other personal needs.  Monitor the patient's symptoms If they are getting sicker, call his or her medical provider and tell them that the patient has, or is being evaluated for, COVID-19 infection. This will help the healthcare provider's office take steps to keep other people from getting infected. Ask the healthcare provider to call the local or state health department.  Limit the number of people who have contact with the patient If possible, have only one caregiver for the patient. Other household members should stay in another home or place of residence. If this is not possible,  they should stay in another room, or be separated from the patient as much as possible. Use a separate bathroom, if available. Restrict visitors who do not have an essential need to be in the home.  Keep older adults, very young children, and other sick people away from the patient Keep older adults, very young children, and those who have compromised immune systems or chronic health conditions away from the patient. This includes people with chronic heart, lung, or kidney conditions, diabetes, and cancer.  Ensure good ventilation Make sure that shared spaces in the home have good air flow, such as from an air conditioner or an opened window, weather permitting.  Wash your hands often Wash your hands often and thoroughly with soap and water for at least 20 seconds. You can use an alcohol based hand sanitizer if soap and water are not available and if your hands are not visibly dirty. Avoid touching your eyes, nose, and mouth with unwashed hands. Use disposable paper towels to dry your hands. If not available, use dedicated cloth towels and replace them when they become wet.  Wear a facemask and gloves Wear a disposable facemask at all times in the room and gloves when you touch or have contact with the patient's blood, body fluids, and/or  secretions or excretions, such as sweat, saliva, sputum, nasal mucus, vomit, urine, or feces.  Ensure the mask fits over your nose and mouth tightly, and do not touch it during use. Throw out disposable facemasks and gloves after using them. Do not reuse. Wash your hands immediately after removing your facemask and gloves. If your personal clothing becomes contaminated, carefully remove clothing and launder. Wash your hands after handling contaminated clothing. Place all used disposable facemasks, gloves, and other waste in a lined container before disposing them with other household waste. Remove gloves and wash your hands immediately after handling these  items.  Do not share dishes, glasses, or other household items with the patient Avoid sharing household items. You should not share dishes, drinking glasses, cups, eating utensils, towels, bedding, or other items with a patient who is confirmed to have, or being evaluated for, COVID-19 infection. After the person uses these items, you should wash them thoroughly with soap and water.  Wash laundry thoroughly Immediately remove and wash clothes or bedding that have blood, body fluids, and/or secretions or excretions, such as sweat, saliva, sputum, nasal mucus, vomit, urine, or feces, on them. Wear gloves when handling laundry from the patient. Read and follow directions on labels of laundry or clothing items and detergent. In general, wash and dry with the warmest temperatures recommended on the label.  Clean all areas the individual has used often Clean all touchable surfaces, such as counters, tabletops, doorknobs, bathroom fixtures, toilets, phones, keyboards, tablets, and bedside tables, every day. Also, clean any surfaces that may have blood, body fluids, and/or secretions or excretions on them. Wear gloves when cleaning surfaces the patient has come in contact with. Use a diluted bleach solution (e.g., dilute bleach with 1 part bleach and 10 parts water) or a household disinfectant with a label that says EPA-registered for coronaviruses. To make a bleach solution at home, add 1 tablespoon of bleach to 1 quart (4 cups) of water. For a larger supply, add  cup of bleach to 1 gallon (16 cups) of water. Read labels of cleaning products and follow recommendations provided on product labels. Labels contain instructions for safe and effective use of the cleaning product including precautions you should take when applying the product, such as wearing gloves or eye protection and making sure you have good ventilation during use of the product. Remove gloves and wash hands immediately after  cleaning.  Monitor yourself for signs and symptoms of illness Caregivers and household members are considered close contacts, should monitor their health, and will be asked to limit movement outside of the home to the extent possible. Follow the monitoring steps for close contacts listed on the symptom monitoring form.   ? If you have additional questions, contact your local health department or call the epidemiologist on call at [phone] (available 24/7). ? This guidance is subject to change. For the most up-to-date guidance from [facility], please refer to their website: [email or web address]

## 2020-09-16 NOTE — Progress Notes (Signed)
[name] are scheduled for a virtual visit with your provider today.    Just as we do with appointments in the office, we must obtain your consent to participate.  Your consent will be active for this visit and any virtual visit you may have with one of our providers in the next 365 days.    If you have a MyChart account, I can also send a copy of this consent to you electronically.  All virtual visits are billed to your insurance company just like a traditional visit in the office.  As this is a virtual visit, video technology does not allow for your provider to perform a traditional examination.  This may limit your provider's ability to fully assess your condition.  If your provider identifies any concerns that need to be evaluated in person or the need to arrange testing such as labs, EKG, etc, we will make arrangements to do so.    Although advances in technology are sophisticated, we cannot ensure that it will always work on either your end or our end.  If the connection with a video visit is poor, we may have to switch to a telephone visit.  With either a video or telephone visit, we are not always able to ensure that we have a secure connection.   I need to obtain your verbal consent now.   Are you willing to proceed with your visit today?   Angela Gregory has provided verbal consent on 09/16/2020 for a virtual visit (video or telephone).   Angela Simas, FNP 09/16/2020  5:22 PM      Virtual Visit via Video   I connected with patient on 09/16/20 at  5:30 PM EDT by a video enabled telemedicine application and verified that I am speaking with the correct person using two identifiers.  Location patient: Home Location provider: [facility] Persons participating in the virtual visit: Patient, Provider  I discussed the limitations of evaluation and management by telemedicine and the availability of in person appointments. The patient expressed understanding and agreed to  proceed.  Subjective:   HPI:   Patient presents via [facility] today  She traveled to [place] returned 09/11/20, on 09/12/20 she developed symptoms with congestion and cough. She first took a test on 09/14/20 that was negative and has continued to have symptoms that have increased to diarrhea and fatigue.  She tested positive for COVID-19 today.   She has a history of Factor V, and is pre diabetic.   She has been vaccinated for COVID-19 with Moderna on 08/16/2020 was scheduled for second vaccine on 09/14/2020 but was having symptoms at that time so she has not been abe to get the second injection yet.   She denies any known history of infection with COVID-19.   Today is day 5 since symptom onset.   ROS:  +cough +congestion +fatigue +diarrhea   See pertinent positives and negatives per HPI.  Patient Active Problem List   Diagnosis Date Noted   Bilateral hand numbness 02/09/2020   Encounter for annual general medical examination with abnormal findings in adult 12/30/2019   Skin lesion 12/30/2019   Prediabetes 12/30/2019   Abdominal pain 04/19/2019   Heterozygous factor V Leiden mutation (HCC) 01/18/2019   GERD (gastroesophageal reflux disease) 12/21/2018   Sleep apnea 12/21/2018   Chronic migraine without aura without status migrainosus, not intractable 12/21/2018   Hyperlipidemia 12/21/2018   Carpal tunnel syndrome 12/21/2018   Low grade squamous intraepithelial lesion (LGSIL) on  cervicovaginal cytologic smear 12/15/2013    Social History   Tobacco Use   Smoking status: Never   Smokeless tobacco: Never  Substance Use Topics   Alcohol use: Yes    Current Outpatient Medications:    acetaminophen (TYLENOL) 650 MG CR tablet, Take 650 mg by mouth every 8 (eight) hours as needed for pain., Disp: , Rfl:    Cetirizine HCl (ZYRTEC PO), Take by mouth. (Patient not taking: No sig reported), Disp: , Rfl:    MELATONIN PO, Take by mouth., Disp: , Rfl:    rosuvastatin (CRESTOR) 10 MG  tablet, TAKE 1 TABLET (10 MG TOTAL) BY MOUTH EVERY EVENING. FOR CHOLESTEROL., Disp: 90 tablet, Rfl: 3   SUMAtriptan (IMITREX) 25 MG tablet, TAKE 1 TABLET BY MOUTH AT MIGRAINE ONSET, MAY REPEAT IN 2 HOURS IF HEADACHE PERSISTS OR RECURS., Disp: 10 tablet, Rfl: 0  No Known Allergies  Objective:   There were no vitals taken for this visit.  Patient is well-developed, well-nourished in no acute distress.  Resting comfortably at home.  Head is normocephalic, atraumatic.  No labored breathing.  Speech is clear and coherent with logical content.  Patient is alert and oriented at baseline.    Assessment and Plan:   Patient has not had recent labs and is also on statin medication so she is not a candidate for Paxlovid at this time.   Discussed taking Molnupiravir. Patient will look into it and consider. Will send to outpatient pharmacy.  Advised this medication is useful only for the first 5 days of infection.   Discussed over the counter management, monitoring SpO2 at home.  Advised on Urgent Care facilities that are open over the weekend if symptoms worsen, she may always report to ED with any severe symptoms at anytime as discussed as well.   Meds ordered this encounter  Medications   molnupiravir EUA 200 mg CAPS    Sig: Take 4 capsules (800 mg total) by mouth 2 (two) times daily for 5 days.    Dispense:  40 capsule    Refill:  0        Angela Simas, FNP 09/16/2020

## 2020-09-17 NOTE — Telephone Encounter (Signed)
Noted  

## 2020-10-13 ENCOUNTER — Other Ambulatory Visit: Payer: Self-pay

## 2020-10-13 ENCOUNTER — Telehealth: Payer: Self-pay

## 2020-10-13 MED ORDER — NA SULFATE-K SULFATE-MG SULF 17.5-3.13-1.6 GM/177ML PO SOLN: 1.0000 | Freq: Once | ORAL | 0 refills | Status: AC

## 2020-10-13 NOTE — Progress Notes (Signed)
Sent updated instructions to patient. Prep has been sent to pharmacy.

## 2020-10-13 NOTE — Telephone Encounter (Signed)
Returned patient call. Patient requested to change procedure date due to personal reasons. Endo unit ([facility]) has been notified of change. Bowel prep will be sent to pharmacy and updated instructions will be sent via my chart and mailed out.

## 2020-10-17 ENCOUNTER — Telehealth: Payer: Self-pay

## 2020-10-17 ENCOUNTER — Telehealth: Payer: Self-pay | Admitting: Gastroenterology

## 2020-10-17 NOTE — Telephone Encounter (Signed)
Patient wants to reschedule procedure. Clinical staff will follow up with patient. 

## 2020-10-17 NOTE — Telephone Encounter (Signed)
Patient has requested to r/s procedure to 9/14. Endo unit has been notified of change. Updated instructions sent via my chart.

## 2020-11-29 ENCOUNTER — Ambulatory Visit: Payer: BC Managed Care – PPO | Admitting: Anesthesiology

## 2020-11-29 ENCOUNTER — Encounter
Admission: RE | Disposition: A | Discharge status: Home / Self Care | Payer: Self-pay | Source: Home / Self Care | Attending: Gastroenterology

## 2020-11-29 ENCOUNTER — Encounter: Payer: Self-pay | Admitting: Gastroenterology

## 2020-11-29 ENCOUNTER — Ambulatory Visit
Admission: RE | Admit: 2020-11-29 | Discharge: 2020-11-29 | Disposition: A | Discharge status: Home / Self Care | Payer: BC Managed Care – PPO | Attending: Gastroenterology | Admitting: Gastroenterology

## 2020-11-29 ENCOUNTER — Other Ambulatory Visit: Payer: Self-pay

## 2020-11-29 DIAGNOSIS — K649 Unspecified hemorrhoids: Secondary | ICD-10-CM | POA: Diagnosis not present

## 2020-11-29 DIAGNOSIS — Z860100 Personal history of colon polyps, unspecified: Secondary | ICD-10-CM

## 2020-11-29 DIAGNOSIS — Z79899 Other long term (current) drug therapy: Secondary | ICD-10-CM | POA: Insufficient documentation

## 2020-11-29 DIAGNOSIS — K648 Other hemorrhoids: Secondary | ICD-10-CM | POA: Insufficient documentation

## 2020-11-29 DIAGNOSIS — K5730 Diverticulosis of large intestine without perforation or abscess without bleeding: Secondary | ICD-10-CM | POA: Insufficient documentation

## 2020-11-29 DIAGNOSIS — Z1211 Encounter for screening for malignant neoplasm of colon: Secondary | ICD-10-CM | POA: Insufficient documentation

## 2020-11-29 DIAGNOSIS — Z8719 Personal history of other diseases of the digestive system: Secondary | ICD-10-CM | POA: Insufficient documentation

## 2020-11-29 DIAGNOSIS — K635 Polyp of colon: Secondary | ICD-10-CM | POA: Diagnosis not present

## 2020-11-29 DIAGNOSIS — K5790 Diverticulosis of intestine, part unspecified, without perforation or abscess without bleeding: Secondary | ICD-10-CM | POA: Diagnosis not present

## 2020-11-29 HISTORY — PX: COLONOSCOPY WITH PROPOFOL: SHX5780

## 2020-11-29 SURGERY — COLONOSCOPY WITH PROPOFOL
Anesthesia: General

## 2020-11-29 MED ORDER — PROPOFOL 500 MG/50ML IV EMUL
INTRAVENOUS | Status: DC | PRN
  Administered 2020-11-29: 125 ug/kg/min via INTRAVENOUS

## 2020-11-29 MED ORDER — PHENYLEPHRINE HCL (PRESSORS) 10 MG/ML IV SOLN
INTRAVENOUS | Status: AC
  Filled 2020-11-29: qty 1

## 2020-11-29 MED ORDER — PROPOFOL 10 MG/ML IV BOLUS
INTRAVENOUS | Status: DC | PRN
  Administered 2020-11-29: 20 mg via INTRAVENOUS
  Administered 2020-11-29: 50 mg via INTRAVENOUS
  Administered 2020-11-29: 20 mg via INTRAVENOUS

## 2020-11-29 MED ORDER — PROPOFOL 500 MG/50ML IV EMUL
INTRAVENOUS | Status: AC
  Filled 2020-11-29: qty 50

## 2020-11-29 MED ORDER — SODIUM CHLORIDE 0.9 % IV SOLN: INTRAVENOUS | Status: DC

## 2020-11-29 MED ORDER — LIDOCAINE HCL (CARDIAC) PF 100 MG/5ML IV SOSY
PREFILLED_SYRINGE | INTRAVENOUS | Status: DC | PRN
  Administered 2020-11-29: 50 mg via INTRAVENOUS

## 2020-11-29 NOTE — Transfer of Care (Signed)
Immediate Anesthesia Transfer of Care Note  Patient: Angela Gregory  Procedure(s) Performed: COLONOSCOPY WITH PROPOFOL  Patient Location: PACU and Endoscopy Unit  Anesthesia Type:General  Level of Consciousness: awake  Airway & Oxygen Therapy: Patient Spontanous Breathing  Post-op Assessment: Report given to RN and Post -op Vital signs reviewed and stable  Post vital signs: Reviewed and stable  Last Vitals:  Vitals Value Taken Time  BP 91/61 11/29/20 0809  Temp    Pulse 100 11/29/20 0809  Resp 17 11/29/20 0809  SpO2 98 % 11/29/20 0809  Vitals shown include unvalidated device data.  Last Pain:  Vitals:   11/29/20 0721  TempSrc: Temporal  PainSc: 0-No pain         Complications: No notable events documented.

## 2020-11-29 NOTE — Anesthesia Preprocedure Evaluation (Signed)
Anesthesia Evaluation  Patient identified by MRN, date of birth, ID band Patient awake    Reviewed: Allergy & Precautions, NPO status , Patient's Chart, lab work & pertinent test results  History of Anesthesia Complications Negative for: history of anesthetic complications  Airway Mallampati: III  TM Distance: >3 FB Neck ROM: full    Dental  (+) Chipped   Pulmonary neg shortness of breath, sleep apnea ,    Pulmonary exam normal        Cardiovascular Exercise Tolerance: Good (-) angina(-) Past MI negative cardio ROS Normal cardiovascular exam     Neuro/Psych  Headaches,  Neuromuscular disease negative psych ROS   GI/Hepatic Neg liver ROS, GERD  Medicated and Controlled,  Endo/Other  negative endocrine ROS  Renal/GU negative Renal ROS  negative genitourinary   Musculoskeletal   Abdominal   Peds  Hematology negative hematology ROS (+)   Anesthesia Other Findings Past Medical History: No date: Abnormal Pap smear of cervix No date: Arthritis     Comment:  Neck, hands No date: Carpal tunnel syndrome No date: Chickenpox No date: Diverticulosis No date: Frequent headaches No date: Hyperlipidemia  Past Surgical History: 2000: BACK SURGERY     Comment:  Disc removal L4-L5 No date: CESAREAN SECTION No date: COLPOSCOPY     Reproductive/Obstetrics negative OB ROS                             Anesthesia Physical Anesthesia Plan  ASA: 3  Anesthesia Plan: General   Post-op Pain Management:    Induction: Intravenous  PONV Risk Score and Plan: Propofol infusion and TIVA  Airway Management Planned: Natural Airway and Nasal Cannula  Additional Equipment:   Intra-op Plan:   Post-operative Plan:   Informed Consent: I have reviewed the patients History and Physical, chart, labs and discussed the procedure including the risks, benefits and alternatives for the proposed anesthesia with  the patient or authorized representative who has indicated his/her understanding and acceptance.     Dental Advisory Given  Plan Discussed with: Anesthesiologist, CRNA and Surgeon  Anesthesia Plan Comments: (Patient consented for risks of anesthesia including but not limited to:  - adverse reactions to medications - risk of airway placement if required - damage to eyes, teeth, lips or other oral mucosa - nerve damage due to positioning  - sore throat or hoarseness - Damage to heart, brain, nerves, lungs, other parts of body or loss of life  Patient voiced understanding.)        Anesthesia Quick Evaluation

## 2020-11-29 NOTE — H&P (Signed)
Melodie Bouillon, MD [address], [address], [place], [place], [address] [address], [address], [place], [place], [address] Phone: [phone]  Fax: [phone]  Primary Care Physician:  Doreene Nest, NP   Pre-Procedure History & Physical: HPI:  Angela Gregory is a 55 y.o. female is here for a colonoscopy.   Past Medical History:  Diagnosis Date   Abnormal Pap smear of cervix    Arthritis    Neck, hands   Carpal tunnel syndrome    Chickenpox    Diverticulosis    Frequent headaches    Hyperlipidemia     Past Surgical History:  Procedure Laterality Date   BACK SURGERY  2000   Disc removal L4-L5   CESAREAN SECTION     COLPOSCOPY      Prior to Admission medications   Medication Sig Start Date End Date Taking? Authorizing Provider  acetaminophen (TYLENOL) 650 MG CR tablet Take 650 mg by mouth every 8 (eight) hours as needed for pain.   Yes [provider]  loratadine (CLARITIN) 10 MG tablet Take 10 mg by mouth daily.   Yes [provider]  MELATONIN PO Take by mouth.   Yes [provider]  rosuvastatin (CRESTOR) 10 MG tablet TAKE 1 TABLET (10 MG TOTAL) BY MOUTH EVERY EVENING. FOR CHOLESTEROL. 03/01/20  Yes Doreene Nest, NP  Cetirizine HCl (ZYRTEC PO) Take by mouth. Patient not taking: No sig reported    [provider]  SUMAtriptan (IMITREX) 25 MG tablet TAKE 1 TABLET BY MOUTH AT MIGRAINE ONSET, MAY REPEAT IN 2 HOURS IF HEADACHE PERSISTS OR RECURS. 03/06/19   Doreene Nest, NP    Allergies as of 04/25/2020   (No Known Allergies)    Family History  Problem Relation Age of Onset   Arthritis Mother    Heart disease Mother    Hyperlipidemia Mother    Diabetes Father    Factor V Leiden deficiency Father    Muscular dystrophy Maternal Grandmother    Heart disease Maternal Grandfather    COPD Paternal Grandmother    Breast cancer Neg Hx     Social History   Socioeconomic History   Marital status: Divorced     Spouse name: Not on file   Number of children: Not on file   Years of education: Not on file   Highest education level: Not on file  Occupational History   Not on file  Tobacco Use   Smoking status: Never   Smokeless tobacco: Never  Vaping Use   Vaping Use: Never used  Substance and Sexual Activity   Alcohol use: Yes    Alcohol/week: 1.0 standard drink    Types: 1 Glasses of wine per week    Comment: none last 4 days   Drug use: Never   Sexual activity: Yes  Other Topics Concern   Not on file  Social History Narrative   Divorced.   2 children.   Works in Audiological scientist.   Social Determinants of Health   Financial Resource Strain: Not on file  Food Insecurity: Not on file  Transportation Needs: Not on file  Physical Activity: Not on file  Stress: Not on file  Social Connections: Not on file  Intimate Partner Violence: Not on file    Review of Systems: See HPI, otherwise negative ROS  Physical Exam: Constitutional: General:   Alert,  Well-developed, well-nourished, pleasant and cooperative in NAD BP (!) 150/102   Pulse 95   Temp (!) 97 F (36.1  C) (Temporal)   Resp 20   Ht 5\' 3"  (1.6 m)   Wt 95.3 kg   SpO2 97%   BMI 37.20 kg/m   Head: Normocephalic, atraumatic.   Eyes:  Sclera clear, no icterus.   Conjunctiva pink.   Mouth:  No deformity or lesions, oropharynx pink & moist.  Neck:  Supple, trachea midline  Respiratory: Normal respiratory effort  Gastrointestinal:  Soft, non-tender and non-distended without masses, hepatosplenomegaly or hernias noted.  No guarding or rebound tenderness.     Cardiac: No clubbing or edema.  No cyanosis. Normal posterior tibial pedal pulses noted.  Lymphatic:  No significant cervical adenopathy.  Psych:  Alert and cooperative. Normal mood and affect.  Musculoskeletal:   Symmetrical without gross deformities. 5/5 Lower extremity strength bilaterally.  Skin: Warm. Intact without significant lesions or rashes. No  jaundice.  Neurologic:  Face symmetrical, tongue midline, Normal sensation to touch;  grossly normal neurologically.  Psych:  Alert and oriented x3, Alert and cooperative. Normal mood and affect.  Impression/Plan: Angela Gregory is here for a colonoscopy to be performed for history of polyps. Last colonoscopy in 2017. Procedure report not available, but pt reports and PCP note from Oct 2021 states that next one was recommended to be in 2022.   Risks, benefits, limitations, and alternatives regarding  colonoscopy have been reviewed with the patient.  Questions have been answered.  All parties agreeable.   2023, MD  11/29/2020, 7:33 AM

## 2020-11-29 NOTE — Anesthesia Postprocedure Evaluation (Signed)
Anesthesia Post Note  Patient: Angela Gregory  Procedure(s) Performed: COLONOSCOPY WITH PROPOFOL  Patient location during evaluation: Endoscopy Anesthesia Type: General Level of consciousness: awake and alert Pain management: pain level controlled Vital Signs Assessment: post-procedure vital signs reviewed and stable Respiratory status: spontaneous breathing, nonlabored ventilation, respiratory function stable and patient connected to nasal cannula oxygen Cardiovascular status: blood pressure returned to baseline and stable Postop Assessment: no apparent nausea or vomiting Anesthetic complications: no   No notable events documented.   Last Vitals:  Vitals:   11/29/20 0829 11/29/20 0833  BP: 112/77 125/69  Pulse:    Resp:    Temp:    SpO2:      Last Pain:  Vitals:   11/29/20 0829  TempSrc:   PainSc: 0-No pain                 Cleda Mccreedy Alver Leete

## 2020-11-29 NOTE — Op Note (Signed)
[facility] Gastroenterology Patient Name: Angela Gregory Procedure Date: 11/29/2020 6:44 AM MRN: [id number] Account #: [id number] Date of Birth: [birth year] Admit Type: Outpatient Age: 55 Room: [facility] ENDO ROOM 3 Gender: Female Note Status: Finalized Instrument Name: Prentice Docker [id number] Procedure:             Colonoscopy Indications:           High risk colon cancer surveillance: Personal history                         of colonic polyps Providers:             Khamia Stambaugh B. Maximino Greenland MD, MD Referring MD:          Doreene Nest (Referring MD) Medicines:             Monitored Anesthesia Care Complications:         No immediate complications. Procedure:             Pre-Anesthesia Assessment:                        - ASA Grade Assessment: II - A patient with mild                         systemic disease.                        - Prior to the procedure, a History and Physical was                         performed, and patient medications, allergies and                         sensitivities were reviewed. The patient's tolerance                         of previous anesthesia was reviewed.                        - The risks and benefits of the procedure and the                         sedation options and risks were discussed with the                         patient. All questions were answered and informed                         consent was obtained.                        - Patient identification and proposed procedure were                         verified prior to the procedure by the physician, the                         nurse, the anesthesiologist, the anesthetist and the  technician. The procedure was verified in the                         procedure room.                        After obtaining informed consent, the colonoscope was                         passed under direct vision. Throughout the procedure,                         the  patient's blood pressure, pulse, and oxygen                         saturations were monitored continuously. The                         Colonoscope was introduced through the anus and                         advanced to the the cecum, identified by appendiceal                         orifice and ileocecal valve. The colonoscopy was                         performed with ease. The patient tolerated the                         procedure well. The quality of the bowel preparation                         was good. Findings:      The perianal and digital rectal examinations were normal.      Two sessile polyps were found in the sigmoid colon. The polyps were 3 to       4 mm in size. These polyps were removed with a jumbo cold forceps.       Resection and retrieval were complete.      A few diverticula were found in the ascending colon.      Multiple diverticula were found in the sigmoid colon.      The exam was otherwise without abnormality.      The rectum, sigmoid colon, descending colon, transverse colon, ascending       colon and cecum appeared normal.      Non-bleeding internal hemorrhoids were found during retroflexion. Impression:            - Two 3 to 4 mm polyps in the sigmoid colon, removed                         with a jumbo cold forceps. Resected and retrieved.                        - Diverticulosis in the sigmoid colon.                        - The examination was otherwise normal.                        -  The rectum, sigmoid colon, descending colon,                         transverse colon, ascending colon and cecum are normal.                        - Non-bleeding internal hemorrhoids. Recommendation:        - Discharge patient to home (with escort).                        - Request prior colonoscopy records from PCP from 2017                        - High fiber diet.                        - Advance diet as tolerated.                        - Continue present  medications.                        - Await pathology results.                        - Repeat colonoscopy date to be determined after                         pending pathology results are reviewed.                        - The findings and recommendations were discussed with                         the patient.                        - The findings and recommendations were discussed with                         the patient's family.                        - Return to primary care physician as previously                         scheduled. Procedure Code(s):     --- Professional ---                        [phone], Colonoscopy, flexible; with biopsy, single or                         multiple Diagnosis Code(s):     --- Professional ---                        Z86.010, Personal history of colonic polyps                        K63.5, Polyp of colon CPT copyright 2019 American Medical Association. All rights reserved. The codes documented in this report are preliminary and  upon coder review may  be revised to meet current compliance requirements.  Melodie Bouillon, MD Michel Bickers B. Maximino Greenland MD, MD 11/29/2020 8:17:59 AM This report has been signed electronically. Number of Addenda: 0 Note Initiated On: 11/29/2020 6:44 AM Scope Withdrawal Time: 0 hours 19 minutes 17 seconds  Total Procedure Duration: 0 hours 25 minutes 6 seconds  Estimated Blood Loss:  Estimated blood loss: none.      [facility]

## 2020-11-30 ENCOUNTER — Encounter: Payer: Self-pay | Admitting: Gastroenterology

## 2020-12-01 LAB — SURGICAL PATHOLOGY

## 2020-12-11 ENCOUNTER — Telehealth: Payer: Self-pay

## 2020-12-11 NOTE — Telephone Encounter (Signed)
Med records [facility] [phone] requesting colonoscopy with pathology report

## 2020-12-19 ENCOUNTER — Encounter: Payer: Self-pay | Admitting: Gastroenterology

## 2021-03-01 ENCOUNTER — Other Ambulatory Visit: Payer: Self-pay | Admitting: Primary Care

## 2021-03-01 DIAGNOSIS — E785 Hyperlipidemia, unspecified: Secondary | ICD-10-CM

## 2021-05-25 ENCOUNTER — Other Ambulatory Visit: Payer: BC Managed Care – PPO

## 2021-06-01 ENCOUNTER — Other Ambulatory Visit (HOSPITAL_COMMUNITY)
Admission: RE | Admit: 2021-06-01 | Discharge: 2021-06-01 | Disposition: A | Discharge status: Home / Self Care | Payer: BC Managed Care – PPO | Source: Ambulatory Visit | Attending: Primary Care | Admitting: Primary Care

## 2021-06-01 ENCOUNTER — Ambulatory Visit (INDEPENDENT_AMBULATORY_CARE_PROVIDER_SITE_OTHER)
Admission: RE | Admit: 2021-06-01 | Discharge: 2021-06-01 | Disposition: A | Discharge status: Home / Self Care | Payer: BC Managed Care – PPO | Source: Ambulatory Visit | Attending: Primary Care | Admitting: Primary Care

## 2021-06-01 ENCOUNTER — Ambulatory Visit (INDEPENDENT_AMBULATORY_CARE_PROVIDER_SITE_OTHER): Payer: BC Managed Care – PPO | Admitting: Primary Care

## 2021-06-01 ENCOUNTER — Other Ambulatory Visit: Payer: Self-pay

## 2021-06-01 ENCOUNTER — Encounter: Payer: Self-pay | Admitting: Primary Care

## 2021-06-01 VITALS — BP 120/82 | HR 62 | Temp 97.6°F | Ht 63.0 in | Wt 225.0 lb

## 2021-06-01 DIAGNOSIS — G8929 Other chronic pain: Secondary | ICD-10-CM

## 2021-06-01 DIAGNOSIS — D6851 Activated protein C resistance: Secondary | ICD-10-CM | POA: Diagnosis not present

## 2021-06-01 DIAGNOSIS — Z23 Encounter for immunization: Secondary | ICD-10-CM | POA: Diagnosis not present

## 2021-06-01 DIAGNOSIS — M2578 Osteophyte, vertebrae: Secondary | ICD-10-CM | POA: Diagnosis not present

## 2021-06-01 DIAGNOSIS — G43709 Chronic migraine without aura, not intractable, without status migrainosus: Secondary | ICD-10-CM

## 2021-06-01 DIAGNOSIS — G5603 Carpal tunnel syndrome, bilateral upper limbs: Secondary | ICD-10-CM

## 2021-06-01 DIAGNOSIS — Z0001 Encounter for general adult medical examination with abnormal findings: Secondary | ICD-10-CM | POA: Diagnosis not present

## 2021-06-01 DIAGNOSIS — F419 Anxiety disorder, unspecified: Secondary | ICD-10-CM | POA: Diagnosis not present

## 2021-06-01 DIAGNOSIS — M542 Cervicalgia: Secondary | ICD-10-CM

## 2021-06-01 DIAGNOSIS — E785 Hyperlipidemia, unspecified: Secondary | ICD-10-CM | POA: Diagnosis not present

## 2021-06-01 DIAGNOSIS — R7303 Prediabetes: Secondary | ICD-10-CM | POA: Diagnosis not present

## 2021-06-01 DIAGNOSIS — Z1231 Encounter for screening mammogram for malignant neoplasm of breast: Secondary | ICD-10-CM

## 2021-06-01 DIAGNOSIS — Z124 Encounter for screening for malignant neoplasm of cervix: Secondary | ICD-10-CM | POA: Diagnosis not present

## 2021-06-01 DIAGNOSIS — G4730 Sleep apnea, unspecified: Secondary | ICD-10-CM

## 2021-06-01 DIAGNOSIS — Z8742 Personal history of other diseases of the female genital tract: Secondary | ICD-10-CM

## 2021-06-01 LAB — LIPID PANEL
Cholesterol: 273 mg/dL — ABNORMAL HIGH (ref 0–200)
HDL: 50 mg/dL (ref >39.00)
NonHDL: 223.03
Total CHOL/HDL Ratio: 5
Triglycerides: 205 mg/dL — ABNORMAL HIGH (ref 0.0–149.0)
VLDL: 41 mg/dL — ABNORMAL HIGH (ref 0.0–40.0)

## 2021-06-01 LAB — COMPREHENSIVE METABOLIC PANEL
ALT: 37 U/L — ABNORMAL HIGH (ref 0–35)
AST: 20 U/L (ref 0–37)
Albumin: 4.4 g/dL (ref 3.5–5.2)
Alkaline Phosphatase: 73 U/L (ref 39–117)
BUN: 10 mg/dL (ref 6–23)
CO2: 25 mEq/L (ref 19–32)
Calcium: 9.3 mg/dL (ref 8.4–10.5)
Chloride: 104 mEq/L (ref 96–112)
Creatinine, Ser: 0.74 mg/dL (ref 0.40–1.20)
GFR: 90.23 mL/min (ref >60.00)
Glucose, Bld: 94 mg/dL (ref 70–99)
Potassium: 4.2 mEq/L (ref 3.5–5.1)
Sodium: 138 mEq/L (ref 135–145)
Total Bilirubin: 0.6 mg/dL (ref 0.2–1.2)
Total Protein: 6.8 g/dL (ref 6.0–8.3)

## 2021-06-01 LAB — HEMOGLOBIN A1C: Hgb A1c MFr Bld: 5.8 % (ref 4.6–6.5)

## 2021-06-01 LAB — CBC
HCT: 44.2 % (ref 36.0–46.0)
Hemoglobin: 15.1 g/dL — ABNORMAL HIGH (ref 12.0–15.0)
MCHC: 34.3 g/dL (ref 30.0–36.0)
MCV: 90.5 fl (ref 78.0–100.0)
Platelets: 252 10*3/uL (ref 150.0–400.0)
RBC: 4.89 Mil/uL (ref 3.87–5.11)
RDW: 13 % (ref 11.5–15.5)
WBC: 9.4 10*3/uL (ref 4.0–10.5)

## 2021-06-01 LAB — TSH: TSH: 1.85 u[IU]/mL (ref 0.35–5.50)

## 2021-06-01 LAB — LDL CHOLESTEROL, DIRECT: Direct LDL: 193 mg/dL

## 2021-06-01 MED ORDER — CYCLOBENZAPRINE HCL 5 MG PO TABS: 5.0000 mg | ORAL_TABLET | Freq: Every evening | ORAL | 0 refills | Status: DC | PRN

## 2021-06-01 MED ORDER — SUMATRIPTAN SUCCINATE 25 MG PO TABS: ORAL_TABLET | ORAL | 0 refills | Status: AC

## 2021-06-01 NOTE — Assessment & Plan Note (Signed)
Abnormal Pap smear in October 2021, also with prior history. ? ?Completed colposcopy around 2016 which was negative. ? ?Repeat Pap smear pending today. ?Consider GYN referral if needed. ?

## 2021-06-01 NOTE — Assessment & Plan Note (Signed)
Discussed the importance of a healthy diet and regular exercise in order for weight loss, and to reduce the risk of further co-morbidity. ? ?Repeat A1C pending. ?

## 2021-06-01 NOTE — Assessment & Plan Note (Signed)
Compliant to CPAP machine nightly. ?Continue to monitor.  ?

## 2021-06-01 NOTE — Progress Notes (Signed)
? ?Subjective:  ? ? Patient ID: Angela MarekSonja Gregory, female    DOB: 20-Jun-1965, 56 y.o.   MRN: [id number] ? ?HPI ? ?Angela Gregory is a very pleasant 56 y.o. female who presents today for complete physical and follow up of chronic conditions. ? ?Due for repeat pap smear today given high risk HPV, NILM. She underwent colposcopy around 2016, results were negative.  ? ?She continues to struggle with arthritis pain. Originally diagnosed into her neck, continues to experience chronic pain with limited ROM to her neck, especially when turning her neck to the left. She's taken Tylenol and using topical agents without improvement recently. She recently underwent carpal tunnel release to right upper extremity per Dr. Rosita KeaMenz. Has not mentioned her neck pain yet.  ? ?She would also like to mention anxiety and stress. Present over the last 1-2 years. She's been under a tremendous amount of stress with her husbands health, her chronic arthritis, and frustrations with her weight gain. Symptoms include worrying, feeling anxious, some sleep disturbance, irritability.  ? ?Immunizations: ?-Tetanus: Over 10 years ago.  ?-Influenza: Did not complete this past season  ?-Covid-19: 1 vaccine  ?-Shingles: Never completed  ? ?Diet: Poor diet.  ?Exercise: No regular exercise. ? ?Eye exam: Completes annually  ?Dental exam: Completes semi-annually  ? ?Pap Smear: Completed in 2021, abnormal with high risk HPV, NILM. ?Mammogram: Completed in November 2021 ?Colonoscopy: Completed in 2022, due 2032 ? ? ?BP Readings from Last 3 Encounters:  ?06/01/21 120/82  ?11/29/20 125/69  ?12/30/19 132/82  ? ? ? ? ?Review of Systems  ?Constitutional:  Negative for unexpected weight change.  ?HENT:  Negative for rhinorrhea.   ?Respiratory:  Negative for cough and shortness of breath.   ?Cardiovascular:  Negative for chest pain.  ?Gastrointestinal:  Negative for constipation and diarrhea.  ?Genitourinary:  Negative for difficulty urinating.  ?Musculoskeletal:  Positive for  arthralgias and neck pain.  ?Skin:  Negative for rash.  ?Allergic/Immunologic: Negative for environmental allergies.  ?Neurological:  Negative for dizziness and headaches.  ?Psychiatric/Behavioral:  The patient is nervous/anxious.   ? ?   ? ? ?Past Medical History:  ?Diagnosis Date  ? Abnormal Pap smear of cervix   ? Arthritis   ? Neck, hands  ? Carpal tunnel syndrome   ? Chickenpox   ? Diverticulosis   ? Frequent headaches   ? Hyperlipidemia   ? ? ?Social History  ? ?Socioeconomic History  ? Marital status: Married  ?  Spouse name: Not on file  ? Number of children: Not on file  ? Years of education: Not on file  ? Highest education level: Not on file  ?Occupational History  ? Not on file  ?Tobacco Use  ? Smoking status: Never  ? Smokeless tobacco: Never  ?Vaping Use  ? Vaping Use: Never used  ?Substance and Sexual Activity  ? Alcohol use: Yes  ?  Alcohol/week: 1.0 standard drink  ?  Types: 1 Glasses of wine per week  ?  Comment: none last 4 days  ? Drug use: Never  ? Sexual activity: Yes  ?Other Topics Concern  ? Not on file  ?Social History Narrative  ? Divorced.  ? 2 children.  ? Works in Audiological scientistaccounting.  ? ?Social Determinants of Health  ? ?Financial Resource Strain: Not on file  ?Food Insecurity: Not on file  ?Transportation Needs: Not on file  ?Physical Activity: Not on file  ?Stress: Not on file  ?Social Connections: Not on file  ?Intimate Partner Violence: Not  on file  ? ? ?Past Surgical History:  ?Procedure Laterality Date  ? BACK SURGERY  2000  ? Disc removal L4-L5  ? CESAREAN SECTION    ? COLONOSCOPY WITH PROPOFOL N/A 11/29/2020  ? Procedure: COLONOSCOPY WITH PROPOFOL;  Surgeon: Pasty Spillers, MD;  Location: [facility] ENDOSCOPY;  Service: Endoscopy;  Laterality: N/A;  ? COLPOSCOPY    ? ? ?Family History  ?Problem Relation Age of Onset  ? Arthritis Mother   ? Heart disease Mother   ? Hyperlipidemia Mother   ? Diabetes Father   ? Factor V Leiden deficiency Father   ? Muscular dystrophy Maternal Grandmother    ? Heart disease Maternal Grandfather   ? COPD Paternal Grandmother   ? Breast cancer Neg Hx   ? ? ?No Known Allergies ? ?Current Outpatient Medications on File Prior to Visit  ?Medication Sig Dispense Refill  ? acetaminophen (TYLENOL) 650 MG CR tablet Take 650 mg by mouth every 8 (eight) hours as needed for pain.    ? loratadine (CLARITIN) 10 MG tablet Take 10 mg by mouth daily.    ? MELATONIN PO Take by mouth.    ? SUMAtriptan (IMITREX) 25 MG tablet TAKE 1 TABLET BY MOUTH AT MIGRAINE ONSET, MAY REPEAT IN 2 HOURS IF HEADACHE PERSISTS OR RECURS. 10 tablet 0  ? rosuvastatin (CRESTOR) 10 MG tablet TAKE 1 TABLET (10 MG TOTAL) BY MOUTH EVERY EVENING. FOR CHOLESTEROL. (Patient not taking: Reported on 06/01/2021) 90 tablet 3  ? ?No current facility-administered medications on file prior to visit.  ? ? ?BP 120/82   Pulse 62   Temp 97.6 ?F (36.4 ?C) (Oral)   Ht 5\' 3"  (1.6 m)   Wt 225 lb (102.1 kg)   SpO2 97%   BMI 39.86 kg/m?  ?Objective:  ? Physical Exam ?HENT:  ?   Right Ear: Tympanic membrane and ear canal normal.  ?   Left Ear: Tympanic membrane and ear canal normal.  ?   Nose: Nose normal.  ?Eyes:  ?   Conjunctiva/sclera: Conjunctivae normal.  ?   Pupils: Pupils are equal, round, and reactive to light.  ?Neck:  ?   Thyroid: No thyromegaly.  ?   Comments: Decrease in range of motion with most planes of movement, left lateral rotation and extension worst. ?Cardiovascular:  ?   Rate and Rhythm: Normal rate and regular rhythm.  ?   Heart sounds: No murmur heard. ?Pulmonary:  ?   Effort: Pulmonary effort is normal.  ?   Breath sounds: Normal breath sounds. No rales.  ?Abdominal:  ?   General: Bowel sounds are normal.  ?   Palpations: Abdomen is soft.  ?   Tenderness: There is no abdominal tenderness.  ?Musculoskeletal:  ?   Cervical back: Neck supple. Pain with movement present. Decreased range of motion.  ?Lymphadenopathy:  ?   Cervical: No cervical adenopathy.  ?Skin: ?   General: Skin is warm and dry.  ?   Findings:  No rash.  ?Neurological:  ?   Mental Status: She is alert and oriented to person, place, and time.  ?   Cranial Nerves: No cranial nerve deficit.  ?   Deep Tendon Reflexes: Reflexes are normal and symmetric.  ?Psychiatric:  ?   Comments: Appears anxious today, tearfulness  ? ? ? ? ? ?   ?Assessment & Plan:  ? ? ? ? ?This visit occurred during the SARS-CoV-2 public health emergency.  Safety protocols were in place, including screening questions  prior to the visit, additional usage of staff PPE, and extensive cleaning of exam room while observing appropriate contact time as indicated for disinfecting solutions.  ?

## 2021-06-01 NOTE — Assessment & Plan Note (Signed)
Improved. ? ?Infrequent use of sumatriptan 25 mg.  ?Continue sumatriptan 25 mg PRN. ? ?Refills provided.  ?

## 2021-06-01 NOTE — Addendum Note (Signed)
Addended by: Donnamarie Poag on: 06/01/2021 03:51 PM ? ? Modules accepted: Orders ? ?

## 2021-06-01 NOTE — Assessment & Plan Note (Signed)
We will obtain plain films today. ? ?She will mention her symptoms to her orthopedist. ? ?Prescription for cyclobenzaprine 5 mg sent to pharmacy to use at bedtime as needed. ? ?Significant decrease in range of motion noted on exam today. ?

## 2021-06-01 NOTE — Assessment & Plan Note (Signed)
Myalgias with atorvastatin previously, unsure if she had this reaction with rosuvastatin. ? ?Repeat lipid panel pending. ?She has not taken rosuvastatin at all. ?

## 2021-06-01 NOTE — Assessment & Plan Note (Signed)
Previously following with hematology. ? ?Advise provided, she will see them PRN.  ? ?Office notes from November 2020 reviewed. ?

## 2021-06-01 NOTE — Patient Instructions (Addendum)
[facility] to [facility] your [facility].  ? ?Stop by the lab and xray prior to leaving today. I will notify you of your results once received.  ? ?You may take cyclobenzaprine (Flexeril) at bedtime as needed for muscle spasms. ? ?Please speak with Dr. Rudene Christians regarding your neck pain. ? ?Schedule a nurse visit for 2 to 6 months from now for your second shingles shot ? ?It was a pleasure to see you today! ? ?Preventive Care 36-109 Years Old, Female ?Preventive care refers to lifestyle choices and visits with your health care provider that can promote health and wellness. Preventive care visits are also called wellness exams. ?What can I expect for my preventive care visit? ?Counseling ?Your health care provider may ask you questions about your: ?Medical history, including: ?Past medical problems. ?Family medical history. ?Pregnancy history. ?Current health, including: ?Menstrual cycle. ?Method of birth control. ?Emotional well-being. ?Home life and relationship well-being. ?Sexual activity and sexual health. ?Lifestyle, including: ?Alcohol, nicotine or tobacco, and drug use. ?Access to firearms. ?Diet, exercise, and sleep habits. ?Work and work Statistician. ?Sunscreen use. ?Safety issues such as seatbelt and bike helmet use. ?Physical exam ?Your health care provider will check your: ?Height and weight. These may be used to calculate your BMI (body mass index). BMI is a measurement that tells if you are at a healthy weight. ?Waist circumference. This measures the distance around your waistline. This measurement also tells if you are at a healthy weight and may help predict your risk of certain diseases, such as type 2 diabetes and high blood pressure. ?Heart rate and blood pressure. ?Body temperature. ?Skin for abnormal spots. ?What immunizations do I need? ?Vaccines are usually given at various ages, according to a schedule. Your health care provider will recommend vaccines for you based on your age, medical  history, and lifestyle or other factors, such as travel or where you work. ?What tests do I need? ?Screening ?Your health care provider may recommend screening tests for certain conditions. This may include: ?Lipid and cholesterol levels. ?Diabetes screening. This is done by checking your blood sugar (glucose) after you have not eaten for a while (fasting). ?Pelvic exam and Pap test. ?Hepatitis B test. ?Hepatitis C test. ?HIV (human immunodeficiency virus) test. ?STI (sexually transmitted infection) testing, if you are at risk. ?Lung cancer screening. ?Colorectal cancer screening. ?Mammogram. Talk with your health care provider about when you should start having regular mammograms. This may depend on whether you have a family history of breast cancer. ?BRCA-related cancer screening. This may be done if you have a family history of breast, ovarian, tubal, or peritoneal cancers. ?Bone density scan. This is done to screen for osteoporosis. ?Talk with your health care provider about your test results, treatment options, and if necessary, the need for more tests. ?Follow these instructions at home: ?Eating and drinking ? ?Eat a diet that includes fresh fruits and vegetables, whole grains, lean protein, and low-fat dairy products. ?Take vitamin and mineral supplements as recommended by your health care provider. ?Do not drink alcohol if: ?Your health care provider tells you not to drink. ?You are pregnant, may be pregnant, or are planning to become pregnant. ?If you drink alcohol: ?Limit how much you have to 0-1 drink a day. ?Know how much alcohol is in your drink. In the [place] one drink equals one 12 oz bottle of beer (355 mL), one 5 oz glass of wine (148 mL), or one 1? oz glass of hard liquor (44 mL). ?Lifestyle ?Brush  your teeth every morning and night with fluoride toothpaste. Floss one time each day. ?Exercise for at least 30 minutes 5 or more days each week. ?Do not use any products that contain nicotine or tobacco.  These products include cigarettes, chewing tobacco, and vaping devices, such as e-cigarettes. If you need help quitting, ask your health care provider. ?Do not use drugs. ?If you are sexually active, practice safe sex. Use a condom or other form of protection to prevent STIs. ?If you do not wish to become pregnant, use a form of birth control. If you plan to become pregnant, see your health care provider for a prepregnancy visit. ?Take aspirin only as told by your health care provider. Make sure that you understand how much to take and what form to take. Work with your health care provider to find out whether it is safe and beneficial for you to take aspirin daily. ?Find healthy ways to manage stress, such as: ?Meditation, yoga, or listening to music. ?Journaling. ?Talking to a trusted person. ?Spending time with friends and family. ?Minimize exposure to UV radiation to reduce your risk of skin cancer. ?Safety ?Always wear your seat belt while driving or riding in a vehicle. ?Do not drive: ?If you have been drinking alcohol. Do not ride with someone who has been drinking. ?When you are tired or distracted. ?While texting. ?If you have been using any mind-altering substances or drugs. ?Wear a helmet and other protective equipment during sports activities. ?If you have firearms in your house, make sure you follow all gun safety procedures. ?Seek help if you have been physically or sexually abused. ?What's next? ?Visit your health care provider once a year for an annual wellness visit. ?Ask your health care provider how often you should have your eyes and teeth checked. ?Stay up to date on all vaccines. ?This information is not intended to replace advice given to you by your health care provider. Make sure you discuss any questions you have with your health care provider. ?Document Revised: 08/30/2020 Document Reviewed: 08/30/2020 ?Elsevier Patient Education ? [place]. ? ?

## 2021-06-01 NOTE — Assessment & Plan Note (Signed)
Tetanus due, updated today.  First Shingrix fax provided today. ? ?Pap smear due, completed today. ?Mammogram due, orders placed. ?Colonoscopy up-to-date, due 2032. ? ?Discussed the importance of a healthy diet and regular exercise in order for weight loss, and to reduce the risk of further co-morbidity. ? ?Exam today as noted ?Labs pending. ?

## 2021-06-01 NOTE — Assessment & Plan Note (Signed)
Chronic over the last 1 to 2 years. ? ?Offered treatment for which she, declines at this time, she will update if she changes her mind. ?

## 2021-06-01 NOTE — Assessment & Plan Note (Signed)
Completed right carpal tunnel release surgery per Dr. Rosita Kea. ?May undergo left soon. ? ? ?

## 2021-06-03 DIAGNOSIS — E785 Hyperlipidemia, unspecified: Secondary | ICD-10-CM

## 2021-06-03 DIAGNOSIS — R7303 Prediabetes: Secondary | ICD-10-CM

## 2021-06-03 MED ORDER — ROSUVASTATIN CALCIUM 10 MG PO TABS: 10.0000 mg | ORAL_TABLET | Freq: Every evening | ORAL | 3 refills | Status: DC

## 2021-06-03 MED ORDER — OZEMPIC (0.25 OR 0.5 MG/DOSE) 2 MG/1.5ML ~~LOC~~ SOPN: PEN_INJECTOR | SUBCUTANEOUS | 0 refills | Status: DC

## 2021-06-05 LAB — CYTOLOGY - PAP
Adequacy: ABSENT
Comment: NEGATIVE
Diagnosis: UNDETERMINED — AB
High risk HPV: NEGATIVE

## 2021-06-06 NOTE — Telephone Encounter (Signed)
Duplicate see other message for information.  

## 2021-06-13 NOTE — Telephone Encounter (Signed)
Fax received medications was denied. I have sent patient my chart to let her know and placed fax in your box for review.  ?

## 2021-06-20 DIAGNOSIS — M65331 Trigger finger, right middle finger: Secondary | ICD-10-CM | POA: Diagnosis not present

## 2021-06-20 DIAGNOSIS — G5602 Carpal tunnel syndrome, left upper limb: Secondary | ICD-10-CM | POA: Diagnosis not present

## 2021-06-28 ENCOUNTER — Other Ambulatory Visit: Payer: Self-pay | Admitting: Primary Care

## 2021-06-28 DIAGNOSIS — G43709 Chronic migraine without aura, not intractable, without status migrainosus: Secondary | ICD-10-CM

## 2021-07-16 DIAGNOSIS — G5602 Carpal tunnel syndrome, left upper limb: Secondary | ICD-10-CM | POA: Diagnosis not present

## 2021-07-22 DIAGNOSIS — J040 Acute laryngitis: Secondary | ICD-10-CM

## 2021-07-23 ENCOUNTER — Ambulatory Visit: Payer: BC Managed Care – PPO | Admitting: Nurse Practitioner

## 2021-07-23 ENCOUNTER — Encounter: Payer: Self-pay | Admitting: Nurse Practitioner

## 2021-07-23 VITALS — BP 130/90 | HR 103 | Temp 96.6°F | Resp 18 | Wt 224.0 lb

## 2021-07-23 DIAGNOSIS — J040 Acute laryngitis: Secondary | ICD-10-CM | POA: Diagnosis not present

## 2021-07-23 MED ORDER — METHYLPREDNISOLONE ACETATE 40 MG/ML IJ SUSP
40.0000 mg | Freq: Once | INTRAMUSCULAR | Status: AC
  Administered 2021-07-23: 40 mg via INTRAMUSCULAR

## 2021-07-23 NOTE — Assessment & Plan Note (Signed)
Patient signs and symptoms likely viral in nature.  Did go over over-the-counter remedies that she started performing.  We will administer Depo-Medrol 40 mg IM x1 to help with inflammation around the vocal cords.  Did recommend patient try rest her vocal cords as much as possible.  Did provide a note for her employer so they can make the accommodations.  Follow-up if no improvement.  Did discuss patient she should start improving in the next 3 to 4 days if not she can reach out we will consider antibiotic use likely azithromycin ?

## 2021-07-23 NOTE — Telephone Encounter (Signed)
Called patient appointment set up to see [facility] today.  ?

## 2021-07-23 NOTE — Patient Instructions (Signed)
Nice to see you today ?You should start improving in the next 3 days. Reach out to me if not ?Follow up if symptoms worsen or fail to improve ?

## 2021-07-23 NOTE — Progress Notes (Signed)
? ?  Acute Office Visit ? ?Subjective:  ? ?  ?Patient ID: Angela Gregory, female    DOB: [birth year], 56 y.o.   MRN: [id number] ? ?Chief Complaint  ?Patient presents with  ? Sore Throat  ?  Sx started on 07/18/21, lost voice, cough-dry. No fever, no runny nose, no headache. Covid test not done. Has done home remedies like hot tea, honey etc. Voice/throat is worse today.  ? ? ? ?Patient is in today for Sore throat ? ?Symptoms started on 07/18/2021 ?No sick contacts per patient report. States that was in and out of the hopsital and could have been exposed ?No covid test ?Has had one vaccine and then no others  ?Flu vaccine UTD. ?Hot tea and honey, cinnamon, Plenty of fluids. ?States that it has gotten worse since onset ? ? ?Review of Systems  ?Constitutional:  Negative for chills, fever and malaise/fatigue.  ?HENT:  Positive for ear pain (full feeling). Negative for sinus pain and sore throat.   ?Respiratory:  Positive for cough (dry cough) and shortness of breath (baseline).   ?Cardiovascular:  Negative for chest pain.  ?Musculoskeletal:  Negative for joint pain and myalgias.  ?Neurological:  Negative for dizziness and headaches.  ? ? ?   ?Objective:  ?  ?BP 130/90   Pulse (!) 103   Temp (!) 96.6 ?F (35.9 ?C) (Temporal)   Resp 18   Wt 224 lb (101.6 kg)   SpO2 98%   BMI 39.68 kg/m?  ? ? ?Physical Exam ?Vitals and nursing note reviewed.  ?Constitutional:   ?   Appearance: Normal appearance.  ?HENT:  ?   Right Ear: Tympanic membrane, ear canal and external ear normal.  ?   Left Ear: Tympanic membrane, ear canal and external ear normal.  ?   Nose:  ?   Right Sinus: No maxillary sinus tenderness or frontal sinus tenderness.  ?   Left Sinus: No maxillary sinus tenderness or frontal sinus tenderness.  ?   Mouth/Throat:  ?   Mouth: Mucous membranes are moist.  ?   Pharynx: Oropharynx is clear.  ?Cardiovascular:  ?   Rate and Rhythm: Normal rate and regular rhythm.  ?   Heart sounds: Normal heart sounds.  ?Pulmonary:  ?   Effort:  Pulmonary effort is normal.  ?   Breath sounds: Normal breath sounds.  ?Lymphadenopathy:  ?   Cervical: Cervical adenopathy present.  ?Neurological:  ?   Mental Status: She is alert.  ? ? ?No results found for any visits on 07/23/21. ? ? ?   ?Assessment & Plan:  ? ?Problem List Items Addressed This Visit   ? ?  ? Respiratory  ? Laryngitis - Primary  ?  Patient signs and symptoms likely viral in nature.  Did go over over-the-counter remedies that she started performing.  We will administer Depo-Medrol 40 mg IM x1 to help with inflammation around the vocal cords.  Did recommend patient try rest her vocal cords as much as possible.  Did provide a note for her employer so they can make the accommodations.  Follow-up if no improvement.  Did discuss patient she should start improving in the next 3 to 4 days if not she can reach out we will consider antibiotic use likely azithromycin ? ?  ?  ? ? ?Meds ordered this encounter  ?Medications  ? methylPREDNISolone acetate (DEPO-MEDROL) injection 40 mg  ? ? ?Return if symptoms worsen or fail to improve. ? ?Audria Nine, NP ? ? ?

## 2021-07-27 MED ORDER — AZITHROMYCIN 250 MG PO TABS: ORAL_TABLET | ORAL | 0 refills | Status: AC

## 2021-07-27 NOTE — Telephone Encounter (Signed)
Sending to treating provider.  ?Susy Frizzle, can you take a look? ?

## 2021-08-08 ENCOUNTER — Ambulatory Visit: Payer: BC Managed Care – PPO

## 2021-08-16 ENCOUNTER — Encounter: Payer: Self-pay | Admitting: Radiology

## 2021-08-16 ENCOUNTER — Ambulatory Visit
Admission: RE | Admit: 2021-08-16 | Discharge: 2021-08-16 | Disposition: A | Discharge status: Home / Self Care | Payer: BC Managed Care – PPO | Source: Ambulatory Visit | Attending: Primary Care | Admitting: Primary Care

## 2021-08-16 DIAGNOSIS — Z1231 Encounter for screening mammogram for malignant neoplasm of breast: Secondary | ICD-10-CM | POA: Insufficient documentation

## 2021-08-24 ENCOUNTER — Telehealth: Payer: Self-pay

## 2021-08-24 ENCOUNTER — Ambulatory Visit: Payer: BC Managed Care – PPO | Admitting: Primary Care

## 2021-08-24 NOTE — Telephone Encounter (Signed)
[facility] Night - Client Nonclinical Telephone Record  [facility] Client [facility] Night - Client Client Site [facility] - Night Provider Vernona Rieger - NP Contact Type Call Who Is Calling Patient / Member / Family / Caregiver Caller Name [name] Caller Phone Number [phone] Patient Name Angela Gregory Patient DOB 1965/08/18 Call Type Message Only Information Provided Reason for Call Request to [facility] Appointment Initial Comment Caller states she needs to cancel her appointment for 08/24/21. Patient request to speak to RN No Disp. Time Disposition Final User 08/24/2021 6:21:40 AM General Information Provided Yes Lonna Cobb Call Closed By: Lonna Cobb Transaction Date/Time: 08/24/2021 6:19:19 AM (ET    Appt cancelled per pt request and sent to [facility] CMA.

## 2021-08-24 NOTE — Telephone Encounter (Signed)
Called patient appointment rescheduled

## 2021-08-28 ENCOUNTER — Ambulatory Visit: Payer: BC Managed Care – PPO | Admitting: Primary Care

## 2021-09-14 ENCOUNTER — Ambulatory Visit: Payer: BC Managed Care – PPO | Admitting: Primary Care

## 2021-10-09 ENCOUNTER — Ambulatory Visit: Payer: BC Managed Care – PPO | Admitting: Primary Care

## 2021-10-09 ENCOUNTER — Encounter: Payer: Self-pay | Admitting: Primary Care

## 2021-10-09 VITALS — BP 136/78 | HR 106 | Temp 98.4°F | Ht 63.0 in | Wt 225.0 lb

## 2021-10-09 DIAGNOSIS — E785 Hyperlipidemia, unspecified: Secondary | ICD-10-CM

## 2021-10-09 DIAGNOSIS — R051 Acute cough: Secondary | ICD-10-CM | POA: Diagnosis not present

## 2021-10-09 DIAGNOSIS — R5382 Chronic fatigue, unspecified: Secondary | ICD-10-CM | POA: Diagnosis not present

## 2021-10-09 MED ORDER — AZITHROMYCIN 250 MG PO TABS
ORAL_TABLET | ORAL | 0 refills | Status: DC
Start: 2021-10-09 — End: 2022-07-12

## 2021-10-09 MED ORDER — BENZONATATE 200 MG PO CAPS: 200.0000 mg | ORAL_CAPSULE | Freq: Three times a day (TID) | ORAL | 0 refills | Status: DC | PRN

## 2021-10-09 NOTE — Assessment & Plan Note (Signed)
Uncontrolled despite conservative treatment. Given duration of symptoms, coupled with presentation, treat for presumed bacterial involvement.   Start Azithromycin antibiotics for infection. Take 2 tablets by mouth today, then 1 tablet daily for 4 additional days. Start [employer] for cough. Take 1 capsule by mouth three times daily as needed for cough.  Discussed other conservative care. Return precautions provided.

## 2021-10-09 NOTE — Patient Instructions (Signed)
Start Azithromycin antibiotics for infection. Take 2 tablets by mouth today, then 1 tablet daily for 4 additional days.  You may take Benzonatate capsules for cough. Take 1 capsule by mouth three times daily as needed for cough.  It was a pleasure to see you today!  

## 2021-10-09 NOTE — Assessment & Plan Note (Signed)
Likely a combination of her busy work schedule, anxiety, deconditioning.   Encouraged to start working on weight loss. Offered to check B12 and vitamin D, she kindly declines today.  Labs reviewed from March 2023 including TSH.

## 2021-10-09 NOTE — Progress Notes (Signed)
Subjective:    Patient ID: Lorayne Marek, female    DOB: 01-22-1966, 56 y.o.   MRN: [id number]  Cough Associated symptoms include shortness of breath. Pertinent negatives include no fever, postnasal drip, rhinorrhea or sore throat.    Havilah Topor is a very pleasant 56 y.o. female with a history of chronic migraines, GERD, hyperlipidemia, prediabetes who presents today to discuss cough, follow up on hyperlipidemia.   1) Acute Cough: Symptom onset 11 days ago after returning from a business trip. She left for another business trip last week and symptoms had continued. Symptoms include headaches, productive cough (clear sputum), cough, fatigued, shortness of breath. She denies fevers, nasal congestion. She took a Covid-19 test two days ago which was negative.   She's taken Tylenol, Mucinex, cough drops without improvement.   2) Hyperlipidemia: Evaluated in March 2023, LDL was 193. She requested to work on lifestyle changes and recheck levels rather than starting treatment. Since her last visit she's not been able to focus on lifestyle changes given her crazy work schedule. 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   She is requesting weight loss treatment to jump start her metabolism. Her insurance company will not cover [id number]. She was once treated with an oral medication for weight loss, ended up regaining her weight back. She continues to feel fatigued daily, she is compliant to CPAP machine nightly. She does experience daily anxiety for which she overall controls.   BP Readings from Last 3 Encounters:  10/09/21 136/78  07/23/21 130/90  06/01/21 120/82       Review of Systems  Constitutional:  Positive for fatigue. Negative for fever.  HENT:  Positive for congestion. Negative for postnasal drip, rhinorrhea and sore throat.   Respiratory:  Positive for cough and shortness of breath.          Past Medical History:  Diagnosis Date   Abdominal pain 04/19/2019   Abnormal Pap  smear of cervix    Arthritis    Neck, hands   Carpal tunnel syndrome    Chickenpox    Diverticulosis    Frequent headaches    Hyperlipidemia     Social History   Socioeconomic History   Marital status: Married    Spouse name: Not on file   Number of children: Not on file   Years of education: Not on file   Highest education level: Not on file  Occupational History   Not on file  Tobacco Use   Smoking status: Never   Smokeless tobacco: Never  Vaping Use   Vaping Use: Never used  Substance and Sexual Activity   Alcohol use: Yes    Alcohol/week: 1.0 standard drink of alcohol    Types: 1 Glasses of wine per week    Comment: none last 4 days   Drug use: Never   Sexual activity: Yes  Other Topics Concern   Not on file  Social History Narrative   Divorced.   2 children.   Works in Audiological scientist.   Social Determinants of Health   Financial Resource Strain: Not on file  Food Insecurity: Not on file  Transportation Needs: Not on file  Physical Activity: Not on file  Stress: Not on file  Social Connections: Not on file  Intimate Partner Violence: Not on file    Past Surgical History:  Procedure Laterality Date   BACK SURGERY  2000   Disc removal L4-L5   CESAREAN SECTION     COLONOSCOPY WITH PROPOFOL N/A 11/29/2020  Procedure: COLONOSCOPY WITH PROPOFOL;  Surgeon: Pasty Spillers, MD;  Location: [facility] ENDOSCOPY;  Service: Endoscopy;  Laterality: N/A;   COLPOSCOPY      Family History  Problem Relation Age of Onset   Arthritis Mother    Heart disease Mother    Hyperlipidemia Mother    Diabetes Father    Factor V Leiden deficiency Father    Muscular dystrophy Maternal Grandmother    Heart disease Maternal Grandfather    COPD Paternal Grandmother    Breast cancer Neg Hx     No Known Allergies  Current Outpatient Medications on File Prior to Visit  Medication Sig Dispense Refill   acetaminophen (TYLENOL) 650 MG CR tablet Take 650 mg by mouth every 8 (eight)  hours as needed for pain.     cyclobenzaprine (FLEXERIL) 5 MG tablet Take 1 tablet (5 mg total) by mouth at bedtime as needed for muscle spasms. 30 tablet 0   loratadine (CLARITIN) 10 MG tablet Take 10 mg by mouth daily.     MELATONIN PO Take by mouth.     rosuvastatin (CRESTOR) 10 MG tablet Take 1 tablet (10 mg total) by mouth every evening. For cholesterol. 90 tablet 3   SUMAtriptan (IMITREX) 25 MG tablet Take 1 tablet by mouth at migraine onset. May repeat in 2 hours if headache persists or recurs. 10 tablet 0   No current facility-administered medications on file prior to visit.    BP 136/78   Pulse (!) 106   Temp 98.4 F (36.9 C) (Oral)   Ht 5\' 3"  (1.6 m)   Wt 225 lb (102.1 kg)   LMP 08/14/2018   SpO2 95%   BMI 39.86 kg/m  Objective:   Physical Exam Constitutional:      Appearance: She is ill-appearing.  HENT:     Right Ear: Tympanic membrane and ear canal normal.     Left Ear: Tympanic membrane and ear canal normal.     Nose:     Right Sinus: No maxillary sinus tenderness or frontal sinus tenderness.     Left Sinus: No maxillary sinus tenderness or frontal sinus tenderness.  Eyes:     Conjunctiva/sclera: Conjunctivae normal.  Cardiovascular:     Rate and Rhythm: Normal rate and regular rhythm.  Pulmonary:     Effort: Pulmonary effort is normal.     Breath sounds: Examination of the right-lower field reveals rhonchi. Examination of the left-lower field reveals rhonchi. Rhonchi present. No wheezing or rales.     Comments: Persistent dry cough noted during visit. Musculoskeletal:     Cervical back: Neck supple.  Lymphadenopathy:     Cervical: No cervical adenopathy.  Skin:    General: Skin is warm and dry.           Assessment & Plan:   Problem List Items Addressed This Visit       Other   Hyperlipidemia    Uncontrolled with LDL of 193 in March 2023. She is not ready to repeat lipids today. She will set up a lab appointment to do so in the near future.    Long discussion today regarding diet and exercise to reduce weight and cholesterol.       Acute cough - Primary    Uncontrolled despite conservative treatment. Given duration of symptoms, coupled with presentation, treat for presumed bacterial involvement.   Start Azithromycin antibiotics for infection. Take 2 tablets by mouth today, then 1 tablet daily for 4 additional days. Start April 2023 for  cough. Take 1 capsule by mouth three times daily as needed for cough.  Discussed other conservative care. Return precautions provided.       Relevant Medications   azithromycin (ZITHROMAX) 250 MG tablet   benzonatate (TESSALON) 200 MG capsule   Chronic fatigue    Likely a combination of her busy work schedule, anxiety, deconditioning.   Encouraged to start working on weight loss. Offered to check B12 and vitamin D, she kindly declines today.  Labs reviewed from March 2023 including TSH.          Doreene Nest, NP

## 2021-10-09 NOTE — Assessment & Plan Note (Signed)
Uncontrolled with LDL of 193 in March 2023. She is not ready to repeat lipids today. She will set up a lab appointment to do so in the near future.   Long discussion today regarding diet and exercise to reduce weight and cholesterol.

## 2021-10-12 DIAGNOSIS — R051 Acute cough: Secondary | ICD-10-CM

## 2021-10-12 MED ORDER — ALBUTEROL SULFATE HFA 108 (90 BASE) MCG/ACT IN AERS: 2.0000 | INHALATION_SPRAY | Freq: Four times a day (QID) | RESPIRATORY_TRACT | 2 refills | Status: AC | PRN

## 2022-03-08 DIAGNOSIS — Z9889 Other specified postprocedural states: Secondary | ICD-10-CM | POA: Diagnosis not present

## 2022-03-08 DIAGNOSIS — M65331 Trigger finger, right middle finger: Secondary | ICD-10-CM | POA: Diagnosis not present

## 2022-03-08 DIAGNOSIS — M2540 Effusion, unspecified joint: Secondary | ICD-10-CM | POA: Diagnosis not present

## 2022-03-21 DIAGNOSIS — M65331 Trigger finger, right middle finger: Secondary | ICD-10-CM | POA: Diagnosis not present

## 2022-06-20 ENCOUNTER — Other Ambulatory Visit: Payer: Self-pay | Admitting: Primary Care

## 2022-06-20 DIAGNOSIS — E785 Hyperlipidemia, unspecified: Secondary | ICD-10-CM

## 2022-06-20 NOTE — Telephone Encounter (Signed)
Patient is overdue for CPE/follow up, this will be required prior to any further refills.  Please schedule, thank you!   

## 2022-06-20 NOTE — Telephone Encounter (Signed)
Patient has been scheduled

## 2022-07-03 ENCOUNTER — Encounter: Payer: BC Managed Care – PPO | Admitting: Primary Care

## 2022-07-10 ENCOUNTER — Encounter: Payer: BC Managed Care – PPO | Admitting: Primary Care

## 2022-07-12 ENCOUNTER — Other Ambulatory Visit (HOSPITAL_COMMUNITY)
Admission: RE | Admit: 2022-07-12 | Discharge: 2022-07-12 | Disposition: A | Discharge status: Home / Self Care | Payer: BC Managed Care – PPO | Source: Ambulatory Visit | Attending: Primary Care | Admitting: Primary Care

## 2022-07-12 ENCOUNTER — Ambulatory Visit (INDEPENDENT_AMBULATORY_CARE_PROVIDER_SITE_OTHER): Payer: BC Managed Care – PPO | Admitting: Primary Care

## 2022-07-12 ENCOUNTER — Encounter: Payer: Self-pay | Admitting: Primary Care

## 2022-07-12 VITALS — BP 134/92 | HR 93 | Temp 97.0°F | Ht 63.0 in | Wt 225.0 lb

## 2022-07-12 DIAGNOSIS — G4730 Sleep apnea, unspecified: Secondary | ICD-10-CM

## 2022-07-12 DIAGNOSIS — G8929 Other chronic pain: Secondary | ICD-10-CM

## 2022-07-12 DIAGNOSIS — G43709 Chronic migraine without aura, not intractable, without status migrainosus: Secondary | ICD-10-CM | POA: Diagnosis not present

## 2022-07-12 DIAGNOSIS — E785 Hyperlipidemia, unspecified: Secondary | ICD-10-CM

## 2022-07-12 DIAGNOSIS — Z0000 Encounter for general adult medical examination without abnormal findings: Secondary | ICD-10-CM | POA: Diagnosis not present

## 2022-07-12 DIAGNOSIS — Z23 Encounter for immunization: Secondary | ICD-10-CM | POA: Diagnosis not present

## 2022-07-12 DIAGNOSIS — E6609 Other obesity due to excess calories: Secondary | ICD-10-CM

## 2022-07-12 DIAGNOSIS — M542 Cervicalgia: Secondary | ICD-10-CM

## 2022-07-12 DIAGNOSIS — Z124 Encounter for screening for malignant neoplasm of cervix: Secondary | ICD-10-CM | POA: Diagnosis not present

## 2022-07-12 DIAGNOSIS — Z6841 Body Mass Index (BMI) 40.0 and over, adult: Secondary | ICD-10-CM | POA: Insufficient documentation

## 2022-07-12 DIAGNOSIS — R7303 Prediabetes: Secondary | ICD-10-CM

## 2022-07-12 DIAGNOSIS — K219 Gastro-esophageal reflux disease without esophagitis: Secondary | ICD-10-CM | POA: Diagnosis not present

## 2022-07-12 DIAGNOSIS — Z1231 Encounter for screening mammogram for malignant neoplasm of breast: Secondary | ICD-10-CM

## 2022-07-12 DIAGNOSIS — R030 Elevated blood-pressure reading, without diagnosis of hypertension: Secondary | ICD-10-CM

## 2022-07-12 DIAGNOSIS — F419 Anxiety disorder, unspecified: Secondary | ICD-10-CM

## 2022-07-12 DIAGNOSIS — Z6839 Body mass index (BMI) 39.0-39.9, adult: Secondary | ICD-10-CM

## 2022-07-12 DIAGNOSIS — E66812 Obesity, class 2: Secondary | ICD-10-CM

## 2022-07-12 LAB — COMPREHENSIVE METABOLIC PANEL
ALT: 47 U/L — ABNORMAL HIGH (ref 0–35)
AST: 27 U/L (ref 0–37)
Albumin: 4.4 g/dL (ref 3.5–5.2)
Alkaline Phosphatase: 83 U/L (ref 39–117)
BUN: 13 mg/dL (ref 6–23)
CO2: 26 mEq/L (ref 19–32)
Calcium: 9.5 mg/dL (ref 8.4–10.5)
Chloride: 103 mEq/L (ref 96–112)
Creatinine, Ser: 0.82 mg/dL (ref 0.40–1.20)
GFR: 79.16 mL/min (ref >60.00)
Glucose, Bld: 107 mg/dL — ABNORMAL HIGH (ref 70–99)
Potassium: 4.3 mEq/L (ref 3.5–5.1)
Sodium: 138 mEq/L (ref 135–145)
Total Bilirubin: 0.6 mg/dL (ref 0.2–1.2)
Total Protein: 6.9 g/dL (ref 6.0–8.3)

## 2022-07-12 LAB — LIPID PANEL
Cholesterol: 180 mg/dL (ref 0–200)
HDL: 48.4 mg/dL (ref >39.00)
LDL Cholesterol: 103 mg/dL — ABNORMAL HIGH (ref 0–99)
NonHDL: 132.05
Total CHOL/HDL Ratio: 4
Triglycerides: 147 mg/dL (ref 0.0–149.0)
VLDL: 29.4 mg/dL (ref 0.0–40.0)

## 2022-07-12 LAB — HEMOGLOBIN A1C: Hgb A1c MFr Bld: 6.1 % (ref 4.6–6.5)

## 2022-07-12 NOTE — Assessment & Plan Note (Signed)
Controlled.  Continue Tums PRN.  

## 2022-07-12 NOTE — Assessment & Plan Note (Signed)
Repeat A1C pending.  Discussed the importance of a healthy diet and regular exercise in order for weight loss, and to reduce the risk of further co-morbidity. Consider Ozempic vs Zepbound. She will check with her employer regarding coverage.

## 2022-07-12 NOTE — Assessment & Plan Note (Signed)
Family history of heart disease in mother.  Discussed the need for weight loss with a healthy diet and exercise. Consider Ozempic vs Zepbound, she will check with her employer for coverage.   I've also asked her to monitor BP at home.

## 2022-07-12 NOTE — Assessment & Plan Note (Signed)
Upcoming appointment to check pressures. Continue CPAP nightly.

## 2022-07-12 NOTE — Assessment & Plan Note (Signed)
Second Shingrix vaccine due and provided.  Pap smear due, completed today Mammogram due in June, orders placed. Colonoscopy UTD, due 2032  Discussed the importance of a healthy diet and regular exercise in order for weight loss, and to reduce the risk of further co-morbidity.  Exam stable. Labs pending.  Follow up in 1 year for repeat physical.

## 2022-07-12 NOTE — Assessment & Plan Note (Signed)
Repeat lipid panel pending.  Discussed the importance of a healthy diet and regular exercise in order for weight loss, and to reduce the risk of further co-morbidity. Continue rosuvastatin 10 mg daily. 

## 2022-07-12 NOTE — Assessment & Plan Note (Signed)
Controlled.  Continue cyclobenzaprine 5 mg PRN. Irregular use.

## 2022-07-12 NOTE — Patient Instructions (Addendum)
Ask your HR department about Wegovy, Ozempic, Zepbound for weight loss.   Continue to work on [employer] and exercise.  Monitor your blood pressure, it should run less than 135 on top and less than 90 on bottom.  It was a pleasure to see you today!

## 2022-07-12 NOTE — Assessment & Plan Note (Signed)
Overall controlled.  No concerns today. Continue to monitor.  

## 2022-07-12 NOTE — Assessment & Plan Note (Signed)
Controlled.  Continue sumatriptan 25 mg PRN. 

## 2022-07-12 NOTE — Assessment & Plan Note (Signed)
Candidate for treatment.  She will check with her employer to see if they will cover GLP 1 agonist. Discussed to work on diet, continue with walking.

## 2022-07-12 NOTE — Progress Notes (Signed)
Subjective:    Patient ID: Angela Gregory, female    DOB: [birth year], 57 y.o.   MRN: [id number]  HPI  Angela Gregory is a very pleasant 57 y.o. female who presents today for complete physical and follow up of chronic conditions.  Immunizations: -Tetanus: Completed in 2023 -Influenza: Completed this season -Shingles: Completed Shingrix dose x 1  Diet: Fair diet. Binge eats junk food with stress.  Exercise: Walking   Eye exam: Completes annually  Dental exam: Completes semi-annually    Pap Smear: 2023, due again today Mammogram: June 2023  Colonoscopy: Completed in 2022, due 2032  BP Readings from Last 3 Encounters:  07/12/22 (!) 136/98  10/09/21 136/78  07/23/21 130/90   Body mass index is 39.86 kg/m.       Review of Systems  Constitutional:  Negative for unexpected weight change.  HENT:  Negative for rhinorrhea.   Respiratory:  Negative for cough and shortness of breath.   Cardiovascular:  Negative for chest pain.  Gastrointestinal:  Negative for constipation and diarrhea.  Genitourinary:  Negative for difficulty urinating.  Musculoskeletal:  Negative for arthralgias and myalgias.  Skin:  Negative for rash.  Allergic/Immunologic: Negative for environmental allergies.  Neurological:  Negative for dizziness and headaches.  Psychiatric/Behavioral:  The patient is nervous/anxious.          Past Medical History:  Diagnosis Date   Abdominal pain 04/19/2019   Abnormal Pap smear of cervix    Arthritis    Neck, hands   Carpal tunnel syndrome    Chickenpox    Diverticulosis    Frequent headaches    Hyperlipidemia     Social History   Socioeconomic History   Marital status: Married    Spouse name: Not on file   Number of children: Not on file   Years of education: Not on file   Highest education level: Not on file  Occupational History   Not on file  Tobacco Use   Smoking status: Never   Smokeless tobacco: Never  Vaping Use   Vaping Use: Never used   Substance and Sexual Activity   Alcohol use: Yes    Alcohol/week: 1.0 standard drink of alcohol    Types: 1 Glasses of wine per week    Comment: none last 4 days   Drug use: Never   Sexual activity: Yes  Other Topics Concern   Not on file  Social History Narrative   Divorced.   2 children.   Works in Audiological scientist.   Social Determinants of Health   Financial Resource Strain: Not on file  Food Insecurity: Not on file  Transportation Needs: Not on file  Physical Activity: Not on file  Stress: Not on file  Social Connections: Not on file  Intimate Partner Violence: Not on file    Past Surgical History:  Procedure Laterality Date   BACK SURGERY  2000   Disc removal L4-L5   CESAREAN SECTION     COLONOSCOPY WITH PROPOFOL N/A 11/29/2020   Procedure: COLONOSCOPY WITH PROPOFOL;  Surgeon: Pasty Spillers, MD;  Location: [facility] ENDOSCOPY;  Service: Endoscopy;  Laterality: N/A;   COLPOSCOPY      Family History  Problem Relation Age of Onset   Arthritis Mother    Heart disease Mother    Hyperlipidemia Mother    Diabetes Father    Factor V Leiden deficiency Father    Muscular dystrophy Maternal Grandmother    Heart disease Maternal Grandfather    COPD Paternal Grandmother  Breast cancer Neg Hx     No Known Allergies  Current Outpatient Medications on File Prior to Visit  Medication Sig Dispense Refill   acetaminophen (TYLENOL) 650 MG CR tablet Take 650 mg by mouth every 8 (eight) hours as needed for pain.     albuterol (VENTOLIN HFA) 108 (90 Base) MCG/ACT inhaler Inhale 2 puffs into the lungs every 6 (six) hours as needed for wheezing or shortness of breath. 8 g 2   cyclobenzaprine (FLEXERIL) 5 MG tablet Take 1 tablet (5 mg total) by mouth at bedtime as needed for muscle spasms. 30 tablet 0   loratadine (CLARITIN) 10 MG tablet Take 10 mg by mouth daily.     MELATONIN PO Take by mouth.     rosuvastatin (CRESTOR) 10 MG tablet TAKE 1 TABLET (10 MG TOTAL) BY MOUTH EVERY  EVENING. FOR CHOLESTEROL. 30 tablet 0   SUMAtriptan (IMITREX) 25 MG tablet Take 1 tablet by mouth at migraine onset. May repeat in 2 hours if headache persists or recurs. 10 tablet 0   No current facility-administered medications on file prior to visit.    BP (!) 136/98   Pulse 93   Temp (!) 97 F (36.1 C) (Temporal)   Ht 5\' 3"  (1.6 m)   Wt 225 lb (102.1 kg)   LMP 08/14/2018   SpO2 98%   BMI 39.86 kg/m  Objective:   Physical Exam Exam conducted with a chaperone present.  HENT:     Right Ear: Tympanic membrane and ear canal normal.     Left Ear: Tympanic membrane and ear canal normal.     Nose: Nose normal.  Eyes:     Conjunctiva/sclera: Conjunctivae normal.     Pupils: Pupils are equal, round, and reactive to light.  Neck:     Thyroid: No thyromegaly.  Cardiovascular:     Rate and Rhythm: Normal rate and regular rhythm.     Heart sounds: No murmur heard. Pulmonary:     Effort: Pulmonary effort is normal.     Breath sounds: Normal breath sounds. No rales.  Abdominal:     General: Bowel sounds are normal.     Palpations: Abdomen is soft.     Tenderness: There is no abdominal tenderness.  Genitourinary:    Labia:        Right: No tenderness or lesion.        Left: No tenderness or lesion.      Vagina: Normal. No vaginal discharge.     Cervix: Normal.     Uterus: Normal.      Adnexa: Right adnexa normal and left adnexa normal.  Musculoskeletal:        General: Normal range of motion.     Cervical back: Neck supple.  Lymphadenopathy:     Cervical: No cervical adenopathy.  Skin:    General: Skin is warm and dry.     Findings: No rash.  Neurological:     Mental Status: She is alert and oriented to person, place, and time.     Cranial Nerves: No cranial nerve deficit.     Deep Tendon Reflexes: Reflexes are normal and symmetric.  Psychiatric:        Mood and Affect: Mood normal.           Assessment & Plan:  Preventative health care Assessment &  Plan: Second Shingrix vaccine due and provided.  Pap smear due, completed today Mammogram due in June, orders placed. Colonoscopy UTD, due 2032  Discussed the  importance of a healthy diet and regular exercise in order for weight loss, and to reduce the risk of further co-morbidity.  Exam stable. Labs pending.  Follow up in 1 year for repeat physical.    Chronic migraine without aura without status migrainosus, not intractable Assessment & Plan: Controlled.  Continue sumatriptan 25 mg PRN.    Sleep apnea, unspecified type Assessment & Plan: Upcoming appointment to check pressures. Continue CPAP nightly.    Gastroesophageal reflux disease, unspecified whether esophagitis present Assessment & Plan: Controlled.  Continue Tums PRN.   Anxiety Assessment & Plan: Overall controlled.  No concerns today. Continue to monitor.    Chronic neck pain Assessment & Plan: Controlled.  Continue cyclobenzaprine 5 mg PRN. Irregular use.    Elevated blood pressure reading in office without diagnosis of hypertension Assessment & Plan: Family history of heart disease in mother.  Discussed the need for weight loss with a healthy diet and exercise. Consider Ozempic vs Zepbound, she will check with her employer for coverage.   I've also asked her to monitor BP at home.    Prediabetes Assessment & Plan: Repeat A1C pending.  Discussed the importance of a healthy diet and regular exercise in order for weight loss, and to reduce the risk of further co-morbidity. Consider Ozempic vs Zepbound. She will check with her employer regarding coverage.   Orders: -     Hemoglobin A1c  Hyperlipidemia, unspecified hyperlipidemia type Assessment & Plan: Repeat lipid panel pending.  Discussed the importance of a healthy diet and regular exercise in order for weight loss, and to reduce the risk of further co-morbidity. Continue rosuvastatin 10 mg daily.  Orders: -     Lipid panel -      Comprehensive metabolic panel -     Lipoprotein A (LPA)  Class 2 obesity due to excess calories without serious comorbidity with body mass index (BMI) of 39.0 to 39.9 in adult Assessment & Plan: Candidate for treatment.  She will check with her employer to see if they will cover GLP 1 agonist. Discussed to work on diet, continue with walking.   Screening mammogram for breast cancer -     3D Screening Mammogram, Left and Right; Future  Screening for cervical cancer -     Cytology - PAP        Doreene Nest, NP

## 2022-07-16 LAB — LIPOPROTEIN A (LPA): Lipoprotein (a): <10 nmol/L (ref <75)

## 2022-07-18 LAB — CYTOLOGY - PAP
Comment: NEGATIVE
Diagnosis: UNDETERMINED — AB
High risk HPV: NEGATIVE

## 2022-07-20 ENCOUNTER — Other Ambulatory Visit: Payer: Self-pay | Admitting: Primary Care

## 2022-07-20 DIAGNOSIS — E785 Hyperlipidemia, unspecified: Secondary | ICD-10-CM

## 2022-07-26 ENCOUNTER — Ambulatory Visit (INDEPENDENT_AMBULATORY_CARE_PROVIDER_SITE_OTHER): Payer: BC Managed Care – PPO | Admitting: Nurse Practitioner

## 2022-07-26 ENCOUNTER — Encounter: Payer: Self-pay | Admitting: Nurse Practitioner

## 2022-07-26 VITALS — BP 138/84 | HR 90 | Temp 97.1°F | Ht 63.0 in | Wt 226.2 lb

## 2022-07-26 DIAGNOSIS — R351 Nocturia: Secondary | ICD-10-CM

## 2022-07-26 DIAGNOSIS — G4733 Obstructive sleep apnea (adult) (pediatric): Secondary | ICD-10-CM | POA: Diagnosis not present

## 2022-07-26 DIAGNOSIS — Z6841 Body Mass Index (BMI) 40.0 and over, adult: Secondary | ICD-10-CM

## 2022-07-26 DIAGNOSIS — F5101 Primary insomnia: Secondary | ICD-10-CM | POA: Diagnosis not present

## 2022-07-26 DIAGNOSIS — N393 Stress incontinence (female) (male): Secondary | ICD-10-CM

## 2022-07-26 DIAGNOSIS — G4700 Insomnia, unspecified: Secondary | ICD-10-CM | POA: Insufficient documentation

## 2022-07-26 MED ORDER — TRAZODONE HCL 50 MG PO TABS
ORAL_TABLET | ORAL | 1 refills | Status: AC
Start: 2022-07-26 — End: ?

## 2022-07-26 NOTE — Assessment & Plan Note (Signed)
Nocturia and stress incontinence during the day. May benefit from pelvic floor rehab or pharmacological therapy. Will send referral to urogynecology.

## 2022-07-26 NOTE — Assessment & Plan Note (Signed)
Worsening insomniac symptoms with onset of menopause.  Insufficient sleep, resulting in increased daytime fatigue symptoms.  She has tried over-the-counter therapy with melatonin.  Does not appear to be related to breakthrough sleep apnea events or poor control based on current download.  Discussed potential treatment options with pharmacological therapy.  Will start her on low-dose trazodone.  Medication side effect profile reviewed.  Will reevaluate at follow-up.  Cautioned to not drive after taking.

## 2022-07-26 NOTE — Patient Instructions (Signed)
Continue to use CPAP every night, minimum of 4-6 hours a night.  Change equipment every 30 days or as directed by DME. Wash your tubing with warm soap and water daily, hang to dry. Wash humidifier portion weekly.  Be aware of reduced alertness and do not drive or operate heavy machinery if experiencing this or drowsiness.  Exercise encouraged, as tolerated. Notify if persistent daytime sleepiness occurs even with consistent use of CPAP.  We discussed how untreated sleep apnea puts an individual at risk for cardiac arrhthymias, pulm HTN, DM, stroke and increases their risk for daytime accidents.   Orders placed for new CPAP machine   Start trazodone 25 mg (1/2 tab) nightly - take 30 minutes before you want to fall asleep. If you do not have any significant morning grogginess and 25 mg doesn't seem to help you fall or stay asleep, you can increase to 50 mg (1 tab) on subsequent nights. Monitor your mood and notify of any significant mood changes. Do not drive after taking.  Referral to urology   Follow up in 6 weeks with Dr. Craige Cotta or Florentina Addison Ethyle Tiedt,NP to see how sleep medicine is going. Ok to do video if needed. If symptoms do not improve or worsen, please contact office for sooner follow up or seek emergency care.

## 2022-07-26 NOTE — Assessment & Plan Note (Signed)
Healthy weight loss encouraged 

## 2022-07-26 NOTE — Progress Notes (Signed)
Reviewed and agree with assessment/plan.   Yanira Tolsma, MD [facility] 07/26/2022, 6:45 PM Pager:  [phone]  

## 2022-07-26 NOTE — Assessment & Plan Note (Signed)
Severe sleep apnea, on CPAP therapy.  She has good compliance and excellent control on current settings.  Residual AHI 2.2/h, no significant leaks.  Machine is around 58 years old.  She is eligible for a new one.  Orders placed today for new auto CPAP 6-14 cmH2O, mask of choice and heated humidity.  Understands proper use and care of device.  Aware safe driving practices.  Patient Instructions  Continue to use CPAP every night, minimum of 4-6 hours a night.  Change equipment every 30 days or as directed by DME. Wash your tubing with warm soap and water daily, hang to dry. Wash humidifier portion weekly.  Be aware of reduced alertness and do not drive or operate heavy machinery if experiencing this or drowsiness.  Exercise encouraged, as tolerated. Notify if persistent daytime sleepiness occurs even with consistent use of CPAP.  We discussed how untreated sleep apnea puts an individual at risk for cardiac arrhthymias, pulm HTN, DM, stroke and increases their risk for daytime accidents.   Orders placed for new CPAP machine   Start trazodone 25 mg (1/2 tab) nightly - take 30 minutes before you want to fall asleep. If you do not have any significant morning grogginess and 25 mg doesn't seem to help you fall or stay asleep, you can increase to 50 mg (1 tab) on subsequent nights. Monitor your mood and notify of any significant mood changes. Do not drive after taking.  Referral to urology   Follow up in 6 weeks with Dr. Craige Cotta or Florentina Addison Alexis Reber,NP to see how sleep medicine is going. Ok to do video if needed. If symptoms do not improve or worsen, please contact office for sooner follow up or seek emergency care.

## 2022-07-26 NOTE — Progress Notes (Signed)
@Patient  ID: Angela Gregory, female    DOB: [birth year], 57 y.o.   MRN: [id number]  Chief Complaint  Patient presents with   Consult    Sleep Study 6-8 years ago. CPAP every night. Mask and pressure are good.     Referring provider: Doreene Nest, NP  HPI: 57 year old female, never smoker referred for sleep consult.  Past medical history significant for OSA on CPAP, chronic migraine headaches, GERD, HLD, prediabetes, anxiety.  TEST/EVENTS:  04/2013 HST: AHI 31.5/h, SpO2 low 69%  07/26/2022: Today-sleep consult Patient resents today for sleep consult, referred by Sammuel Cooper, NP.  She has a history of severe sleep apnea, diagnosed around 8 years ago.  She has been on CPAP since with the same machine.  Settings are currently 6 to 14 cm water.  Prior to starting on CPAP, she would have gasping at night, restless sleep and overall felt very fatigued during the day.  She says that since she has been on therapy, she sleeps much better at night and wakes feeling rested most days.  Still has some occasional daytime fatigue symptoms that seem to be more prevalent over the last year or 2.  She has also been having trouble with nocturia over the last year or 2 as well.  She noticed these changes when she started going through menopause.  Does have some stress incontinence during the day as well.  Has never seen urology or tried any medications for urinary frequency.  She feels like this has disrupted her sleep patterns at night and she is not able to sleep for as long as she used to.  She thinks that this is contributing to her tiredness during the day.  She does also find that she started to have more trouble with falling asleep at night as well.  She has started taking melatonin, which did help for a brief period of time but does not seem to be working as well for her now.  Has never tried any prescription sleep aids.  She denies any morning headaches, breakthrough snoring, sleep parasomnia/paralysis,  drowsy driving.  No history of narcolepsy or symptoms of cataplexy. Goes to bed between 8 to 9:30 PM.  Falls asleep within an hour.  Wakes at least 3-4 times a night.  Usually gets up around 6:30 AM.  Does not operate any heavy machinery in her job Animal nutritionist.  Weight is up about 20 pounds over the last 2 years.  She has trouble exercising due to feeling tired which has contributed to her weight gain.  Sleep study was in March 2015.  Showed severe sleep apnea with AHI 31.5/h.  She wears CPAP nightly.  Residual AHI 2.2/h without any significant leaks on download.  Wears a nasal mask. DME [facility]. No history of cardiac disease, diabetes or stroke.  She is a never smoker.  Rarely drinks alcohol.  No illicit drug use.  No excessive caffeine intake.  Lives with her spouse.  Works as an Glass blower/designer for [employer].  Family history of mother with heart disease.  [place] 10  04/26/2022-07/24/2022: CPAP 6-14 cmH2O 86/90 days; 80% greater than 4 hours; average use 6 hours 19 minutes Pressure 95th 12.2 Leaks 95th 9.5 AHI 2.2   No Known Allergies  Immunization History  Administered Date(s) Administered   Influenza,inj,Quad PF,6+ Mos 12/21/2018   Influenza-Unspecified 06/16/2021   Tdap 06/01/2021   Zoster Recombinat (Shingrix) 06/01/2021, 07/12/2022    Past Medical History:  Diagnosis Date   Abdominal pain 04/19/2019  Abnormal Pap smear of cervix    Arthritis    Neck, hands   Carpal tunnel syndrome    Chickenpox    Diverticulosis    Frequent headaches    Hyperlipidemia     Tobacco History: Social History   Tobacco Use  Smoking Status Never  Smokeless Tobacco Never   Counseling given: Not Answered   Outpatient Medications Prior to Visit  Medication Sig Dispense Refill   acetaminophen (TYLENOL) 650 MG CR tablet Take 650 mg by mouth every 8 (eight) hours as needed for pain.     albuterol (VENTOLIN HFA) 108 (90 Base) MCG/ACT inhaler Inhale 2 puffs into the lungs every 6 (six) hours as  needed for wheezing or shortness of breath. 8 g 2   cyclobenzaprine (FLEXERIL) 5 MG tablet Take 1 tablet (5 mg total) by mouth at bedtime as needed for muscle spasms. 30 tablet 0   loratadine (CLARITIN) 10 MG tablet Take 10 mg by mouth daily.     MELATONIN PO Take by mouth.     rosuvastatin (CRESTOR) 10 MG tablet TAKE 1 TABLET (10 MG TOTAL) BY MOUTH EVERY EVENING. FOR CHOLESTEROL. 90 tablet 3   SUMAtriptan (IMITREX) 25 MG tablet Take 1 tablet by mouth at migraine onset. May repeat in 2 hours if headache persists or recurs. 10 tablet 0   No facility-administered medications prior to visit.     Review of Systems:   Constitutional: No night sweats, fevers, chills, or lassitude. +weight gain, daytime fatigue  HEENT: No headaches, difficulty swallowing, tooth/dental problems, or sore throat. No sneezing, itching, ear ache, nasal congestion, or post nasal drip CV:  No chest pain, orthopnea, PND, swelling in lower extremities, anasarca, dizziness, palpitations, syncope Resp: No shortness of breath with exertion or at rest. No excess mucus or change in color of mucus. No productive or non-productive. No hemoptysis. No wheezing.  No chest wall deformity GI:  No heartburn, indigestion, abdominal pain, nausea, vomiting, diarrhea, change in bowel habits, loss of appetite, bloody stools.  GU: No dysuria, change in color of urine, urgency.  No flank pain, no hematuria. +nocturia, stress incontinence Skin: No rash, lesions, ulcerations MSK:  No joint pain or swelling.  No decreased range of motion.  No back pain. Neuro: No dizziness or lightheadedness.  Psych: No depression or anxiety. Mood stable. +sleep disturbance     Physical Exam:  BP 138/84 (BP Location: Left Arm, Cuff Size: Large)   Pulse 90   Temp (!) 97.1 F (36.2 C)   Ht 5\' 3"  (1.6 m)   Wt 226 lb 3.2 oz (102.6 kg)   LMP 08/14/2018   SpO2 99%   BMI 40.07 kg/m   GEN: Pleasant, interactive, well-appearing; obese; in no acute  distress HEENT:  Normocephalic and atraumatic. PERRLA. Sclera white. Nasal turbinates pink, moist and patent bilaterally. No rhinorrhea present. Oropharynx pink and moist, without exudate or edema. No lesions, ulcerations, or postnasal drip. Mallampati IV NECK:  Supple w/ fair ROM. No JVD present. Normal carotid impulses w/o bruits. Thyroid symmetrical with no goiter or nodules palpated. No lymphadenopathy.   CV: RRR, no m/r/g, no peripheral edema. Pulses intact, +2 bilaterally. No cyanosis, pallor or clubbing. PULMONARY:  Unlabored, regular breathing. Clear bilaterally A&P w/o wheezes/rales/rhonchi. No accessory muscle use.  GI: BS present and normoactive. Soft, non-tender to palpation. No organomegaly or masses detected. No CVA tenderness.  MSK: No erythema, warmth or tenderness. Cap refil <2 sec all extrem. No deformities or joint swelling noted.  Neuro: A/Ox3.  No focal deficits noted.   Skin: Warm, no lesions or rashe Psych: Normal affect and behavior. Judgement and thought content appropriate.     Lab Results:  CBC    Component Value Date/Time   WBC 9.4 06/01/2021 1144   RBC 4.89 06/01/2021 1144   HGB 15.1 (H) 06/01/2021 1144   HCT 44.2 06/01/2021 1144   PLT 252.0 06/01/2021 1144   MCV 90.5 06/01/2021 1144   MCHC 34.3 06/01/2021 1144   RDW 13.0 06/01/2021 1144   LYMPHSABS 2.8 04/20/2019 1140   MONOABS 0.8 04/20/2019 1140   EOSABS 0.1 04/20/2019 1140   BASOSABS 0.1 04/20/2019 1140    BMET    Component Value Date/Time   NA 138 07/12/2022 0823   K 4.3 07/12/2022 0823   CL 103 07/12/2022 0823   CO2 26 07/12/2022 0823   GLUCOSE 107 (H) 07/12/2022 0823   BUN 13 07/12/2022 0823   CREATININE 0.82 07/12/2022 0823   CALCIUM 9.5 07/12/2022 0823    BNP No results found for: "BNP"   Imaging:  No results found.        No data to display          No results found for: "NITRICOXIDE"      Assessment & Plan:   Sleep apnea Severe sleep apnea, on CPAP therapy.   She has good compliance and excellent control on current settings.  Residual AHI 2.2/h, no significant leaks.  Machine is around 58 years old.  She is eligible for a new one.  Orders placed today for new auto CPAP 6-14 cmH2O, mask of choice and heated humidity.  Understands proper use and care of device.  Aware safe driving practices.  Patient Instructions  Continue to use CPAP every night, minimum of 4-6 hours a night.  Change equipment every 30 days or as directed by DME. Wash your tubing with warm soap and water daily, hang to dry. Wash humidifier portion weekly.  Be aware of reduced alertness and do not drive or operate heavy machinery if experiencing this or drowsiness.  Exercise encouraged, as tolerated. Notify if persistent daytime sleepiness occurs even with consistent use of CPAP.  We discussed how untreated sleep apnea puts an individual at risk for cardiac arrhthymias, pulm HTN, DM, stroke and increases their risk for daytime accidents.   Orders placed for new CPAP machine   Start trazodone 25 mg (1/2 tab) nightly - take 30 minutes before you want to fall asleep. If you do not have any significant morning grogginess and 25 mg doesn't seem to help you fall or stay asleep, you can increase to 50 mg (1 tab) on subsequent nights. Monitor your mood and notify of any significant mood changes. Do not drive after taking.  Referral to urology   Follow up in 6 weeks with Dr. Craige Cotta or Florentina Addison Elba Schaber,NP to see how sleep medicine is going. Ok to do video if needed. If symptoms do not improve or worsen, please contact office for sooner follow up or seek emergency care.    Insomnia Worsening insomniac symptoms with onset of menopause.  Insufficient sleep, resulting in increased daytime fatigue symptoms.  She has tried over-the-counter therapy with melatonin.  Does not appear to be related to breakthrough sleep apnea events or poor control based on current download.  Discussed potential treatment options  with pharmacological therapy.  Will start her on low-dose trazodone.  Medication side effect profile reviewed.  Will reevaluate at follow-up.  Cautioned to not drive after taking.  Nocturia  Nocturia and stress incontinence during the day. May benefit from pelvic floor rehab or pharmacological therapy. Will send referral to urogynecology.   Class 3 severe obesity due to excess calories with body mass index (BMI) of 40.0 to 44.9 in adult [facility]) Healthy weight loss encouraged.    I spent 50 minutes of dedicated to the care of this patient on the date of this encounter to include pre-visit review of records, face-to-face time with the patient discussing conditions above, post visit ordering of testing, clinical documentation with the electronic health record, making appropriate referrals as documented, and communicating necessary findings to members of the patients care team.  Noemi Chapel, NP 07/26/2022  Pt aware and understands NP's role.

## 2022-09-13 ENCOUNTER — Ambulatory Visit: Payer: BC Managed Care – PPO | Admitting: Nurse Practitioner

## 2022-10-04 ENCOUNTER — Telehealth: Payer: BC Managed Care – PPO | Admitting: Primary Care

## 2022-10-04 ENCOUNTER — Encounter: Payer: Self-pay | Admitting: Primary Care

## 2022-10-04 VITALS — Ht 63.0 in | Wt 226.0 lb

## 2022-10-04 DIAGNOSIS — U071 COVID-19: Secondary | ICD-10-CM | POA: Diagnosis not present

## 2022-10-04 HISTORY — DX: COVID-19: U07.1

## 2022-10-04 MED ORDER — NIRMATRELVIR/RITONAVIR (PAXLOVID)TABLET
3.0000 | ORAL_TABLET | Freq: Two times a day (BID) | ORAL | 0 refills | Status: AC
Start: 2022-10-04 — End: 2022-10-09

## 2022-10-04 NOTE — Assessment & Plan Note (Signed)
Exam overall stable.  Discussed options. Will proceed with Paxlovid course as she's done well historically.  Rx for Paxlovid sent to pharmacy. Remain home until feeling better.  Follow up PRN

## 2022-10-04 NOTE — Patient Instructions (Signed)
Start the Paxlovid medication as discussed.  Remain at home until you are feeling better.  It was a pleasure to see you today!

## 2022-10-04 NOTE — Progress Notes (Signed)
Patient ID: Angela Gregory, female    DOB: January 15, 1966, 57 y.o.   MRN: [id number]  Virtual visit completed through [employer], a video enabled telemedicine application. Due to national recommendations of social distancing due to COVID-19, a virtual visit is felt to be most appropriate for this patient at this time. Reviewed limitations, risks, security and privacy concerns of performing a virtual visit and the availability of in person appointments. I also reviewed that there may be a patient responsible charge related to this service. The patient agreed to proceed.   Patient location: home Provider location: [place] at [facility], office Persons participating in this virtual visit: patient, provider   If any vitals were documented, they were collected by patient at home unless specified below.    Ht 5\' 3"  (1.6 m)   Wt 226 lb (102.5 kg) Comment: per chart  LMP 08/14/2018   BMI 40.03 kg/m    CC: Covid positive Subjective:   HPI: Angela Gregory is a 57 y.o. female with a history of sleep apnea, migraines, hyperlipidemia, prediabetes presenting on 10/04/2022 for Covid Positive (Home test yesterday. Symptoms started 4 days ago. Cough, congestion, and fever. Denies any sob )  Symptom onset three days ago with nasal congestion, fatigue, and a headache. She then developed a cough and low grade fever. She was at a trade show at the time of symptom onset. She took a home Covid test yesterday which was positive.   She's been taking Mucinex, Tylenol, and Tylenol Cold and Flu without improvement. Her husband has been sick too.   She denies shortness of breath. Her fever has resolved. She's taken Paxlovid before and has done well. She is interested in trying again.     Relevant past medical, surgical, family and social history reviewed and updated as indicated. Interim medical history since our last visit reviewed. Allergies and medications reviewed and updated. Outpatient Medications Prior to Visit   Medication Sig Dispense Refill   acetaminophen (TYLENOL) 650 MG CR tablet Take 650 mg by mouth every 8 (eight) hours as needed for pain.     albuterol (VENTOLIN HFA) 108 (90 Base) MCG/ACT inhaler Inhale 2 puffs into the lungs every 6 (six) hours as needed for wheezing or shortness of breath. 8 g 2   cyclobenzaprine (FLEXERIL) 5 MG tablet Take 1 tablet (5 mg total) by mouth at bedtime as needed for muscle spasms. 30 tablet 0   loratadine (CLARITIN) 10 MG tablet Take 10 mg by mouth daily.     MELATONIN PO Take by mouth.     rosuvastatin (CRESTOR) 10 MG tablet TAKE 1 TABLET (10 MG TOTAL) BY MOUTH EVERY EVENING. FOR CHOLESTEROL. 90 tablet 3   SUMAtriptan (IMITREX) 25 MG tablet Take 1 tablet by mouth at migraine onset. May repeat in 2 hours if headache persists or recurs. 10 tablet 0   traZODone (DESYREL) 50 MG tablet Take 1/2-1 tablet by mouth at bedtime as needed for sleep. Do not drive after taking (Patient not taking: Reported on 10/04/2022) 30 tablet 1   No facility-administered medications prior to visit.     Per HPI unless specifically indicated in ROS section below Review of Systems  Constitutional:  Positive for fatigue.  HENT:  Positive for congestion.   Respiratory:  Negative for shortness of breath.   Cardiovascular:  Negative for chest pain.  Neurological:  Positive for headaches.   Objective:  Ht 5\' 3"  (1.6 m)   Wt 226 lb (102.5 kg) Comment: per chart  LMP  08/14/2018   BMI 40.03 kg/m   Wt Readings from Last 3 Encounters:  10/04/22 226 lb (102.5 kg)  07/26/22 226 lb 3.2 oz (102.6 kg)  07/12/22 225 lb (102.1 kg)       Physical exam: General: Alert and oriented x 3, no distress, appears sickly  Pulmonary: Speaks in complete sentences without increased work of breathing, no cough during visit.  Psychiatric: Normal mood, thought content, and behavior.     Results for orders placed or performed in visit on 07/12/22  Lipid panel  Result Value Ref Range   Cholesterol  180 0 - 200 mg/dL   Triglycerides 161.0 0.0 - 149.0 mg/dL   HDL 96.04 >54.09 mg/dL   VLDL 81.1 0.0 - 91.4 mg/dL   LDL Cholesterol 782 (H) 0 - 99 mg/dL   Total CHOL/HDL Ratio 4    NonHDL 132.05   Hemoglobin A1c  Result Value Ref Range   Hgb A1c MFr Bld 6.1 4.6 - 6.5 %  Comprehensive metabolic panel  Result Value Ref Range   Sodium 138 135 - 145 mEq/L   Potassium 4.3 3.5 - 5.1 mEq/L   Chloride 103 96 - 112 mEq/L   CO2 26 19 - 32 mEq/L   Glucose, Bld 107 (H) 70 - 99 mg/dL   BUN 13 6 - 23 mg/dL   Creatinine, Ser 9.56 0.40 - 1.20 mg/dL   Total Bilirubin 0.6 0.2 - 1.2 mg/dL   Alkaline Phosphatase 83 39 - 117 U/L   AST 27 0 - 37 U/L   ALT 47 (H) 0 - 35 U/L   Total Protein 6.9 6.0 - 8.3 g/dL   Albumin 4.4 3.5 - 5.2 g/dL   GFR 21.30 >86.57 mL/min   Calcium 9.5 8.4 - 10.5 mg/dL  Lipoprotein A (LPA)  Result Value Ref Range   Lipoprotein (a) <10 <75 nmol/L  Cytology - PAP  Result Value Ref Range   High risk HPV Negative    Adequacy      Satisfactory for evaluation; transformation zone component PRESENT.   Diagnosis (A)     - Atypical squamous cells of undetermined significance (ASC-US)   Comment Normal Reference Range HPV - Negative    Assessment & Plan:   Problem List Items Addressed This Visit       Other   COVID-19 virus infection - Primary    Exam overall stable.  Discussed options. Will proceed with Paxlovid course as she's done well historically.  Rx for Paxlovid sent to pharmacy. Remain home until feeling better.  Follow up PRN        Relevant Medications   nirmatrelvir/ritonavir (PAXLOVID) 20 x 150 MG & 10 x 100MG  TABS     Meds ordered this encounter  Medications   nirmatrelvir/ritonavir (PAXLOVID) 20 x 150 MG & 10 x 100MG  TABS    Sig: Take 3 tablets by mouth 2 (two) times daily for 5 days.    Dispense:  30 tablet    Refill:  0    Order Specific Question:   Supervising Provider    Answer:   BEDSOLE, AMY E [[id number]]   No orders of the defined types were  placed in this encounter.   I discussed the assessment and treatment plan with the patient. The patient was provided an opportunity to ask questions and all were answered. The patient agreed with the plan and demonstrated an understanding of the instructions. The patient was advised to call back or seek an in-person evaluation if the symptoms worsen  or if the condition fails to improve as anticipated.  Follow up plan:  Start the Paxlovid medication as discussed.  Remain at home until you are feeling better.  It was a pleasure to see you today!   Doreene Nest, NP

## 2022-10-21 ENCOUNTER — Ambulatory Visit
Admission: RE | Admit: 2022-10-21 | Discharge: 2022-10-21 | Disposition: A | Discharge status: Home / Self Care | Payer: BC Managed Care – PPO | Source: Ambulatory Visit | Attending: Primary Care | Admitting: Primary Care

## 2022-10-21 DIAGNOSIS — Z1231 Encounter for screening mammogram for malignant neoplasm of breast: Secondary | ICD-10-CM | POA: Insufficient documentation

## 2022-12-15 DIAGNOSIS — G4733 Obstructive sleep apnea (adult) (pediatric): Secondary | ICD-10-CM | POA: Diagnosis not present

## 2023-01-09 ENCOUNTER — Ambulatory Visit: Payer: BC Managed Care – PPO | Admitting: Nurse Practitioner

## 2023-01-14 DIAGNOSIS — G4733 Obstructive sleep apnea (adult) (pediatric): Secondary | ICD-10-CM | POA: Diagnosis not present

## 2023-01-29 ENCOUNTER — Other Ambulatory Visit: Payer: Self-pay | Admitting: Primary Care

## 2023-01-29 ENCOUNTER — Encounter: Payer: Self-pay | Admitting: Primary Care

## 2023-01-29 ENCOUNTER — Ambulatory Visit: Payer: BC Managed Care – PPO | Admitting: Primary Care

## 2023-01-29 ENCOUNTER — Ambulatory Visit (INDEPENDENT_AMBULATORY_CARE_PROVIDER_SITE_OTHER)
Admission: RE | Admit: 2023-01-29 | Discharge: 2023-01-29 | Disposition: A | Discharge status: Home / Self Care | Payer: BC Managed Care – PPO | Source: Ambulatory Visit | Attending: Primary Care | Admitting: Primary Care

## 2023-01-29 VITALS — BP 154/88 | HR 94 | Temp 98.2°F | Ht 63.0 in | Wt 230.0 lb

## 2023-01-29 DIAGNOSIS — Z6841 Body Mass Index (BMI) 40.0 and over, adult: Secondary | ICD-10-CM

## 2023-01-29 DIAGNOSIS — M5442 Lumbago with sciatica, left side: Secondary | ICD-10-CM | POA: Diagnosis not present

## 2023-01-29 DIAGNOSIS — G8929 Other chronic pain: Secondary | ICD-10-CM | POA: Diagnosis not present

## 2023-01-29 DIAGNOSIS — E66813 Obesity, class 3: Secondary | ICD-10-CM

## 2023-01-29 DIAGNOSIS — M4316 Spondylolisthesis, lumbar region: Secondary | ICD-10-CM | POA: Diagnosis not present

## 2023-01-29 DIAGNOSIS — M549 Dorsalgia, unspecified: Secondary | ICD-10-CM | POA: Diagnosis not present

## 2023-01-29 DIAGNOSIS — M48061 Spinal stenosis, lumbar region without neurogenic claudication: Secondary | ICD-10-CM | POA: Diagnosis not present

## 2023-01-29 DIAGNOSIS — M47816 Spondylosis without myelopathy or radiculopathy, lumbar region: Secondary | ICD-10-CM | POA: Diagnosis not present

## 2023-01-29 MED ORDER — METHYLPREDNISOLONE ACETATE 80 MG/ML IJ SUSP
80.0000 mg | Freq: Once | INTRAMUSCULAR | Status: AC
  Administered 2023-01-29: 80 mg via INTRAMUSCULAR

## 2023-01-29 MED ORDER — TIRZEPATIDE-WEIGHT MANAGEMENT 2.5 MG/0.5ML ~~LOC~~ SOLN: 2.5000 mg | SUBCUTANEOUS | 0 refills | Status: DC

## 2023-01-29 MED ORDER — CYCLOBENZAPRINE HCL 5 MG PO TABS
5.0000 mg | ORAL_TABLET | Freq: Every evening | ORAL | 0 refills | Status: DC | PRN
Start: 2023-01-29 — End: 2023-03-04

## 2023-01-29 MED ORDER — PREDNISONE 20 MG PO TABS
ORAL_TABLET | ORAL | 0 refills | Status: DC
Start: 2023-01-29 — End: 2023-10-21

## 2023-01-29 NOTE — Patient Instructions (Signed)
Start prednisone 20 mg tablets for sciatica and back pain. Take 3 tablets my mouth once daily in the morning for 3 days, then 2 tablets for 3 days, then 1 tablet for 3 days   You may take the cyclobenzaprine muscle relaxer at bedtime as needed for muscle spasms/back pain.  Complete xray(s) prior to leaving today. I will notify you of your results once received.  You will either be contacted via phone regarding your referral to neurosurgery, or you may receive a letter on your MyChart portal from our referral team with instructions for scheduling an appointment. Please let us know if you have not been contacted by anyone within two weeks.  Start tirzepitide (Zepbound) for weight loss. Start by injecting 2.5 mg into the skin once weekly for 4 weeks, then increase to 5 mg once weekly thereafter. Please notify me once you've used your last 2.5 mg pen so that I can prescribe the next dose.   Schedule a follow-up visit in 3 months after you receive your first Zepbound prescription.  It was a pleasure to see you today!

## 2023-01-29 NOTE — Telephone Encounter (Signed)
Can we start the PA?

## 2023-01-29 NOTE — Assessment & Plan Note (Signed)
No alarm signs on exam or HPI.  Lumbar spine plain films ordered and pending today. Will likely need MRI, await x-ray results.  Referral placed to neurosurgery.  Start cyclobenzaprine 5 mg at bedtime as needed. Start prednisone 60 mg x 3 days, then 40 mg x 3 days, then 20 mg x 3 days.  She will start this regimen tomorrow.  IM Depo-Medrol 80 mg provided today.

## 2023-01-29 NOTE — Addendum Note (Signed)
Addended by: Lonia Blood on: 01/29/2023 01:12 PM   Modules accepted: Orders

## 2023-01-29 NOTE — Progress Notes (Signed)
Subjective:    Patient ID: Angela Gregory, female    DOB: 1965-08-11, 57 y.o.   MRN: [id number]  Back Pain Associated symptoms include numbness. Pertinent negatives include no chest pain or weakness.    Angela Gregory is a very pleasant 57 y.o. female with a history of sleep apnea, hyperlipidemia, prediabetes, class III severe obesity, chronic fatigue, chronic back pain who presents today to discuss multiple concerns.  1) Back Pain: Chronic history of lower back pain, but over the last 6-8 months her pain has progressively increased. Her pain is located to the left lower back mostly, sometimes the right. Also with left lower extremity pain and numbness. Her pain is worse with movement, walking. She cannot stand or walk for longer than 5 minutes without pain. She travels a lot for work, has had a tough time walking through airports due to her pain.   She describes her pain as pulling and stabbing. Sitting and laying help to improve symptoms.   She completes lower back exercises every other day for the last 6 months. She's taking Tylenol Arthritis daily, is applying heat and ice with some improvement.   She underwent back surgery for "disc removal between L4-5" in her early to mid 30's. After the surgery she was told she had scar tissue that would need to be removed eventually.   She does have chronic urinary incontinence, slightly worse than usual.  Denies loss of bowel control.  2) Class 3 Obesity: Chronic for years. She's lost and regained weight multiple times with weight watchers, calorie counting, exercising, Atkins with temporary improvement. She canont exercise due to her chronic back pain. She would like to discuss weight loss treatment, specifically Zepbound.   Body mass index is 40.74 kg/m.  Wt Readings from Last 3 Encounters:  01/29/23 230 lb (104.3 kg)  10/04/22 226 lb (102.5 kg)  07/26/22 226 lb 3.2 oz (102.6 kg)    Diet currently consists of:  Breakfast: [place] bacon, eggs,  fruit, cheese Lunch: Skips sometimes, [place] wrap with veggies, popcorn chips; sandwich, fruit Dinner: Take out food Snacks: Yogurt, fruit, chips, popcorn Desserts: Sometimes daily, sometimes not. Beverages: Water, coffee, occasional juice, unsweet tea  Exercise: None     Review of Systems  Respiratory:  Negative for shortness of breath.   Cardiovascular:  Negative for chest pain.  Musculoskeletal:  Positive for back pain.  Neurological:  Positive for numbness. Negative for weakness.         Past Medical History:  Diagnosis Date   Abdominal pain 04/19/2019   Abnormal Pap smear of cervix    Arthritis    Neck, hands   Carpal tunnel syndrome    Chickenpox    COVID-19 virus infection 10/04/2022   Diverticulosis    Frequent headaches    Hyperlipidemia     Social History   Socioeconomic History   Marital status: Married    Spouse name: Not on file   Number of children: Not on file   Years of education: Not on file   Highest education level: Not on file  Occupational History   Not on file  Tobacco Use   Smoking status: Never   Smokeless tobacco: Never  Vaping Use   Vaping status: Never Used  Substance and Sexual Activity   Alcohol use: Yes    Alcohol/week: 1.0 standard drink of alcohol    Types: 1 Glasses of wine per week    Comment: none last 4 days   Drug use: Never  Sexual activity: Yes  Other Topics Concern   Not on file  Social History Narrative   Divorced.   2 children.   Works in Audiological scientist.   Social Determinants of Health   Financial Resource Strain: Not on file  Food Insecurity: Not on file  Transportation Needs: Not on file  Physical Activity: Not on file  Stress: Not on file  Social Connections: Not on file  Intimate Partner Violence: Not on file    Past Surgical History:  Procedure Laterality Date   BACK SURGERY  2000   Disc removal L4-L5   CESAREAN SECTION     COLONOSCOPY WITH PROPOFOL N/A 11/29/2020   Procedure: COLONOSCOPY WITH  PROPOFOL;  Surgeon: Pasty Spillers, MD;  Location: [facility] ENDOSCOPY;  Service: Endoscopy;  Laterality: N/A;   COLPOSCOPY      Family History  Problem Relation Age of Onset   Arthritis Mother    Heart disease Mother    Hyperlipidemia Mother    Diabetes Father    Factor V Leiden deficiency Father    Muscular dystrophy Maternal Grandmother    Heart disease Maternal Grandfather    COPD Paternal Grandmother    Breast cancer Neg Hx     No Known Allergies  Current Outpatient Medications on File Prior to Visit  Medication Sig Dispense Refill   acetaminophen (TYLENOL) 650 MG CR tablet Take 650 mg by mouth every 8 (eight) hours as needed for pain.     albuterol (VENTOLIN HFA) 108 (90 Base) MCG/ACT inhaler Inhale 2 puffs into the lungs every 6 (six) hours as needed for wheezing or shortness of breath. 8 g 2   loratadine (CLARITIN) 10 MG tablet Take 10 mg by mouth daily.     MELATONIN PO Take by mouth.     rosuvastatin (CRESTOR) 10 MG tablet TAKE 1 TABLET (10 MG TOTAL) BY MOUTH EVERY EVENING. FOR CHOLESTEROL. 90 tablet 3   SUMAtriptan (IMITREX) 25 MG tablet Take 1 tablet by mouth at migraine onset. May repeat in 2 hours if headache persists or recurs. 10 tablet 0   traZODone (DESYREL) 50 MG tablet Take 1/2-1 tablet by mouth at bedtime as needed for sleep. Do not drive after taking (Patient not taking: Reported on 10/04/2022) 30 tablet 1   No current facility-administered medications on file prior to visit.    BP (!) 154/88   Pulse 94   Temp 98.2 F (36.8 C) (Temporal)   Ht 5\' 3"  (1.6 m)   Wt 230 lb (104.3 kg)   LMP 08/14/2018   SpO2 100%   BMI 40.74 kg/m  Objective:   Physical Exam Cardiovascular:     Rate and Rhythm: Normal rate and regular rhythm.  Pulmonary:     Effort: Pulmonary effort is normal.     Breath sounds: Normal breath sounds.  Musculoskeletal:     Cervical back: Neck supple.     Lumbar back: Tenderness present. No bony tenderness. Decreased range of motion.  Negative right straight leg raise test and negative left straight leg raise test.       Back:  Skin:    General: Skin is warm and dry.  Neurological:     Mental Status: She is alert and oriented to person, place, and time.  Psychiatric:        Mood and Affect: Mood normal.           Assessment & Plan:  Chronic left-sided low back pain with left-sided sciatica Assessment & Plan: No alarm signs on  exam or HPI.  Lumbar spine plain films ordered and pending today. Will likely need MRI, await x-ray results.  Referral placed to neurosurgery.  Start cyclobenzaprine 5 mg at bedtime as needed. Start prednisone 60 mg x 3 days, then 40 mg x 3 days, then 20 mg x 3 days.  She will start this regimen tomorrow.  IM Depo-Medrol 80 mg provided today.  Orders: -     DG Lumbar Spine Complete -     Ambulatory referral to Neurosurgery -     Cyclobenzaprine HCl; Take 1 tablet (5 mg total) by mouth at bedtime as needed for muscle spasms.  Dispense: 30 tablet; Refill: 0 -     predniSONE; Take 3 tablets my mouth once daily in the morning for 3 days, then 2 tablets for 3 days, then 1 tablet for 3 days.  Dispense: 18 tablet; Refill: 0  Class 3 severe obesity due to excess calories without serious comorbidity with body mass index (BMI) of 40.0 to 44.9 in adult [facility]) Assessment & Plan: Chronic for years, temporary improvement with weight loss efforts.  Qualifies for GLP-1 agonist treatment.  Agreed to initiate GLP/GIP agonist treatment today.  Start Zepbound 2.5 mg weekly x 4 weeks, then increase to 5 mg weekly thereafter. She will keep [place] updated via MyChart.  Follow-up in 3 months.  Orders: -     Tirzepatide-Weight Management; Inject 2.5 mg into the skin once a week.  Dispense: 2 mL; Refill: 0        Doreene Nest, NP

## 2023-01-29 NOTE — Assessment & Plan Note (Signed)
Chronic for years, temporary improvement with weight loss efforts.  Qualifies for GLP-1 agonist treatment.  Agreed to initiate GLP/GIP agonist treatment today.  Start Zepbound 2.5 mg weekly x 4 weeks, then increase to 5 mg weekly thereafter. She will keep [place] updated via MyChart.  Follow-up in 3 months.

## 2023-01-30 ENCOUNTER — Other Ambulatory Visit (HOSPITAL_COMMUNITY): Payer: Self-pay

## 2023-01-30 MED ORDER — WEGOVY 0.25 MG/0.5ML ~~LOC~~ SOAJ: 0.2500 mg | SUBCUTANEOUS | 0 refills | Status: DC

## 2023-01-30 NOTE — Telephone Encounter (Signed)
Please notify patient that the Zepbound was not covered on her insurance plan.  We can try Wegovy injections for weight loss.  Let me know if she would like to proceed.

## 2023-01-30 NOTE — Telephone Encounter (Signed)
Per test claim, medication is a plan/benefit exclusion, no PA submitted at this time.

## 2023-01-31 NOTE — Telephone Encounter (Signed)
Not covered by insurance.

## 2023-02-03 ENCOUNTER — Encounter: Payer: Self-pay | Admitting: *Deleted

## 2023-02-06 NOTE — Telephone Encounter (Signed)
Can we check on the [facility] PA?

## 2023-02-07 ENCOUNTER — Telehealth: Payer: Self-pay

## 2023-02-07 ENCOUNTER — Other Ambulatory Visit (HOSPITAL_COMMUNITY): Payer: Self-pay

## 2023-02-07 NOTE — Telephone Encounter (Signed)
I am not completely understanding the message here and what I need to tell the patient about her medication. Can someone help explain this further please?

## 2023-02-07 NOTE — Telephone Encounter (Signed)
Pharmacy Patient Advocate Encounter   Received notification from Patient Advice Request messages that prior authorization for Wegovy 0.25MG /0.5ML auto-injectors is required/requested.   Insurance verification completed.   The patient is insured through  [employer]  .   Per test claim: CANCELLED due to: Patient will pay 100% of a discounted price for this medication. Any amount the patient pays will not apply to their deductible or out-of-pocket expenses   Key: [id number]

## 2023-02-10 ENCOUNTER — Other Ambulatory Visit (HOSPITAL_COMMUNITY): Payer: Self-pay

## 2023-02-10 NOTE — Telephone Encounter (Signed)
Medication is not covered by the plan, however they offer a discounted price.   Per test claim 1 month is [phone]

## 2023-02-10 NOTE — Telephone Encounter (Signed)
Noted.     See MyChart message.

## 2023-02-14 DIAGNOSIS — G4733 Obstructive sleep apnea (adult) (pediatric): Secondary | ICD-10-CM | POA: Diagnosis not present

## 2023-02-16 DIAGNOSIS — G8929 Other chronic pain: Secondary | ICD-10-CM

## 2023-02-19 DIAGNOSIS — G8929 Other chronic pain: Secondary | ICD-10-CM

## 2023-02-21 ENCOUNTER — Ambulatory Visit: Payer: BC Managed Care – PPO | Admitting: Nurse Practitioner

## 2023-03-01 ENCOUNTER — Other Ambulatory Visit: Payer: Self-pay | Admitting: Primary Care

## 2023-03-01 DIAGNOSIS — G8929 Other chronic pain: Secondary | ICD-10-CM

## 2023-03-31 ENCOUNTER — Other Ambulatory Visit: Payer: Self-pay | Admitting: Primary Care

## 2023-03-31 DIAGNOSIS — G8929 Other chronic pain: Secondary | ICD-10-CM

## 2023-04-07 ENCOUNTER — Ambulatory Visit: Payer: Self-pay | Admitting: Orthopedic Surgery

## 2023-04-10 ENCOUNTER — Other Ambulatory Visit: Payer: Self-pay | Admitting: Neurosurgery

## 2023-04-10 DIAGNOSIS — G8929 Other chronic pain: Secondary | ICD-10-CM

## 2023-04-14 ENCOUNTER — Encounter: Payer: Self-pay | Admitting: *Deleted

## 2023-04-16 DIAGNOSIS — G4733 Obstructive sleep apnea (adult) (pediatric): Secondary | ICD-10-CM | POA: Diagnosis not present

## 2023-04-17 ENCOUNTER — Ambulatory Visit
Admission: RE | Admit: 2023-04-17 | Discharge: 2023-04-17 | Disposition: A | Discharge status: Home / Self Care | Payer: Managed Care, Other (non HMO) | Source: Ambulatory Visit | Attending: Neurosurgery | Admitting: Neurosurgery

## 2023-04-17 DIAGNOSIS — M5442 Lumbago with sciatica, left side: Secondary | ICD-10-CM | POA: Diagnosis present

## 2023-04-17 DIAGNOSIS — M5441 Lumbago with sciatica, right side: Secondary | ICD-10-CM | POA: Diagnosis present

## 2023-04-17 DIAGNOSIS — G8929 Other chronic pain: Secondary | ICD-10-CM | POA: Diagnosis present

## 2023-05-08 NOTE — Telephone Encounter (Signed)
Referral re-routed to [facility].   Patient contact us and stated that she was seen by someone else and no longer needed our referral.    Angela Gregory   04/14/23 12:53 PM I have already visited [facility] and have an MRI scheduled for this coming Thursday.  For now I am going this direction and will not need [facility].  Thanks for your help!

## 2023-05-08 NOTE — Telephone Encounter (Signed)
Referral closed out with appt information  Pt seen by [facility] in [place]

## 2023-06-05 ENCOUNTER — Telehealth: Payer: Self-pay | Admitting: Nurse Practitioner

## 2023-06-07 ENCOUNTER — Other Ambulatory Visit: Payer: Self-pay | Admitting: Primary Care

## 2023-06-07 DIAGNOSIS — G8929 Other chronic pain: Secondary | ICD-10-CM

## 2023-06-11 NOTE — Telephone Encounter (Signed)
 Fax confirmation received 06/11/23

## 2023-08-16 ENCOUNTER — Other Ambulatory Visit: Payer: Self-pay | Admitting: Primary Care

## 2023-08-16 DIAGNOSIS — G8929 Other chronic pain: Secondary | ICD-10-CM

## 2023-08-17 NOTE — Telephone Encounter (Signed)
 Patient is due for CPE/follow up, this will be required prior to any further refills.  Please schedule, thank you!

## 2023-08-18 NOTE — Telephone Encounter (Signed)
 Spoke to pt, scheduled cpe for 09/26/23

## 2023-08-27 ENCOUNTER — Encounter: Admitting: Primary Care

## 2023-09-16 ENCOUNTER — Encounter: Admitting: Primary Care

## 2023-09-26 ENCOUNTER — Other Ambulatory Visit: Payer: Self-pay | Admitting: Primary Care

## 2023-09-26 DIAGNOSIS — G8929 Other chronic pain: Secondary | ICD-10-CM

## 2023-10-03 ENCOUNTER — Other Ambulatory Visit: Payer: Self-pay | Admitting: Primary Care

## 2023-10-03 DIAGNOSIS — E785 Hyperlipidemia, unspecified: Secondary | ICD-10-CM

## 2023-10-21 ENCOUNTER — Ambulatory Visit: Payer: Self-pay | Admitting: Primary Care

## 2023-10-21 ENCOUNTER — Ambulatory Visit (INDEPENDENT_AMBULATORY_CARE_PROVIDER_SITE_OTHER): Admitting: Primary Care

## 2023-10-21 ENCOUNTER — Encounter: Payer: Self-pay | Admitting: Primary Care

## 2023-10-21 ENCOUNTER — Other Ambulatory Visit (HOSPITAL_COMMUNITY)
Admission: RE | Admit: 2023-10-21 | Discharge: 2023-10-21 | Disposition: A | Discharge status: Home / Self Care | Source: Ambulatory Visit | Attending: Primary Care | Admitting: Primary Care

## 2023-10-21 VITALS — BP 134/82 | HR 86 | Temp 97.2°F | Ht 63.0 in | Wt 226.0 lb

## 2023-10-21 DIAGNOSIS — Z1231 Encounter for screening mammogram for malignant neoplasm of breast: Secondary | ICD-10-CM

## 2023-10-21 DIAGNOSIS — E66813 Obesity, class 3: Secondary | ICD-10-CM

## 2023-10-21 DIAGNOSIS — Z124 Encounter for screening for malignant neoplasm of cervix: Secondary | ICD-10-CM

## 2023-10-21 DIAGNOSIS — G8929 Other chronic pain: Secondary | ICD-10-CM

## 2023-10-21 DIAGNOSIS — G4733 Obstructive sleep apnea (adult) (pediatric): Secondary | ICD-10-CM

## 2023-10-21 DIAGNOSIS — G4700 Insomnia, unspecified: Secondary | ICD-10-CM

## 2023-10-21 DIAGNOSIS — Z0000 Encounter for general adult medical examination without abnormal findings: Secondary | ICD-10-CM

## 2023-10-21 DIAGNOSIS — R7303 Prediabetes: Secondary | ICD-10-CM

## 2023-10-21 DIAGNOSIS — M5442 Lumbago with sciatica, left side: Secondary | ICD-10-CM

## 2023-10-21 DIAGNOSIS — Z8742 Personal history of other diseases of the female genital tract: Secondary | ICD-10-CM

## 2023-10-21 DIAGNOSIS — K219 Gastro-esophageal reflux disease without esophagitis: Secondary | ICD-10-CM

## 2023-10-21 DIAGNOSIS — Z6841 Body Mass Index (BMI) 40.0 and over, adult: Secondary | ICD-10-CM

## 2023-10-21 DIAGNOSIS — E785 Hyperlipidemia, unspecified: Secondary | ICD-10-CM

## 2023-10-21 DIAGNOSIS — G43709 Chronic migraine without aura, not intractable, without status migrainosus: Secondary | ICD-10-CM | POA: Diagnosis not present

## 2023-10-21 LAB — LIPID PANEL
Cholesterol: 172 mg/dL (ref 0–200)
HDL: 52.5 mg/dL (ref >39.00)
LDL Cholesterol: 97 mg/dL (ref 0–99)
NonHDL: 119.04
Total CHOL/HDL Ratio: 3
Triglycerides: 111 mg/dL (ref 0.0–149.0)
VLDL: 22.2 mg/dL (ref 0.0–40.0)

## 2023-10-21 LAB — COMPREHENSIVE METABOLIC PANEL WITH GFR
ALT: 31 U/L (ref 0–35)
AST: 20 U/L (ref 0–37)
Albumin: 4.4 g/dL (ref 3.5–5.2)
Alkaline Phosphatase: 78 U/L (ref 39–117)
BUN: 10 mg/dL (ref 6–23)
CO2: 31 meq/L (ref 19–32)
Calcium: 9.6 mg/dL (ref 8.4–10.5)
Chloride: 103 meq/L (ref 96–112)
Creatinine, Ser: 0.8 mg/dL (ref 0.40–1.20)
GFR: 80.81 mL/min (ref >60.00)
Glucose, Bld: 116 mg/dL — ABNORMAL HIGH (ref 70–99)
Potassium: 4.4 meq/L (ref 3.5–5.1)
Sodium: 141 meq/L (ref 135–145)
Total Bilirubin: 0.6 mg/dL (ref 0.2–1.2)
Total Protein: 6.9 g/dL (ref 6.0–8.3)

## 2023-10-21 LAB — HEMOGLOBIN A1C: Hgb A1c MFr Bld: 6.4 % (ref 4.6–6.5)

## 2023-10-21 MED ORDER — SEMAGLUTIDE-WEIGHT MANAGEMENT 0.25 MG/0.5ML ~~LOC~~ SOAJ: 0.2500 mg | SUBCUTANEOUS | 0 refills | Status: DC

## 2023-10-21 NOTE — Assessment & Plan Note (Signed)
 Repeat A1c pending

## 2023-10-21 NOTE — Assessment & Plan Note (Signed)
Controlled.  Continue CPAP nightly.  

## 2023-10-21 NOTE — Progress Notes (Signed)
 Subjective:    Patient ID: Angela Gregory, female    DOB: [birth year], 58 y.o.   MRN: [id number]  HPI  Angela Gregory is a very pleasant 58 y.o. female who presents today for complete physical and follow up of chronic conditions.  Immunizations: -Tetanus: Completed in 2023  -Shingles: Completed Shingrix  series  Diet: Fair diet.  Exercise: No regular exercise. She would like to try Wegovy  again.   Eye exam: Completed years ago  Dental exam: Completes semi-annually    Pap Smear: Completed in 2024, ASC-US  Mammogram: Completed in August 2024  Colonoscopy: Completed in 2022, due 2032  BP Readings from Last 3 Encounters:  10/21/23 134/82  01/29/23 (!) 154/88  07/26/22 138/84   Wt Readings from Last 3 Encounters:  10/21/23 226 lb (102.5 kg)  01/29/23 230 lb (104.3 kg)  10/04/22 226 lb (102.5 kg)   Body mass index is 40.03 kg/m.     Review of Systems  Constitutional:  Negative for unexpected weight change.  HENT:  Negative for rhinorrhea.   Respiratory:  Negative for cough and shortness of breath.   Cardiovascular:  Negative for chest pain.  Gastrointestinal:  Negative for constipation and diarrhea.  Genitourinary:  Negative for difficulty urinating.  Musculoskeletal:  Positive for arthralgias, back pain and myalgias.  Skin:  Negative for rash.  Allergic/Immunologic: Negative for environmental allergies.  Neurological:  Negative for dizziness and headaches.  Psychiatric/Behavioral:  The patient is not nervous/anxious.          Past Medical History:  Diagnosis Date   Abdominal pain 04/19/2019   Abnormal Pap smear of cervix    Arthritis    Neck, hands   Carpal tunnel syndrome    Chickenpox    COVID-19 virus infection 10/04/2022   Diverticulosis    Frequent headaches    Hyperlipidemia     Social History   Socioeconomic History   Marital status: Married    Spouse name: Not on file   Number of children: Not on file   Years of education: Not on file   Highest  education level: Not on file  Occupational History   Not on file  Tobacco Use   Smoking status: Never   Smokeless tobacco: Never  Vaping Use   Vaping status: Never Used  Substance and Sexual Activity   Alcohol use: Yes    Alcohol/week: 1.0 standard drink of alcohol    Types: 1 Glasses of wine per week    Comment: none last 4 days   Drug use: Never   Sexual activity: Yes  Other Topics Concern   Not on file  Social History Narrative   Divorced.   2 children.   Works in Audiological scientist.   Social Drivers of Corporate investment banker Strain: Not on file  Food Insecurity: Not on file  Transportation Needs: Not on file  Physical Activity: Not on file  Stress: Not on file  Social Connections: Not on file  Intimate Partner Violence: Not on file    Past Surgical History:  Procedure Laterality Date   BACK SURGERY  2000   Disc removal L4-L5   CESAREAN SECTION     COLONOSCOPY WITH PROPOFOL  N/A 11/29/2020   Procedure: COLONOSCOPY WITH PROPOFOL ;  Surgeon: Janalyn Keene NOVAK, MD;  Location: [facility] END[facility]COPY;  Service: Endoscopy;  Laterality: N/A;   COLPOSCOPY      Family History  Problem Relation Age of Onset   Arthritis Mother    Heart disease Mother    Hyperlipidemia Mother  Diabetes Father    Factor V Leiden deficiency Father    Muscular dystrophy Maternal Grandmother    Heart disease Maternal Grandfather    COPD Paternal Grandmother    Breast cancer Neg Hx     No Known Allergies  Current Outpatient Medications on File Prior to Visit  Medication Sig Dispense Refill   acetaminophen (TYLENOL) 650 MG CR tablet Take 650 mg by mouth every 8 (eight) hours as needed for pain.     albuterol  (VENTOLIN  HFA) 108 (90 Base) MCG/ACT inhaler Inhale 2 puffs into the lungs every 6 (six) hours as needed for wheezing or shortness of breath. 8 g 2   loratadine (CLARITIN) 10 MG tablet Take 10 mg by mouth daily.     MELATONIN PO Take by mouth.     rosuvastatin  (CRESTOR ) 10 MG tablet TAKE 1  TABLET (10 MG TOTAL) BY MOUTH EVERY EVENING. FOR CHOLESTEROL. 90 tablet 0   SUMAtriptan  (IMITREX ) 25 MG tablet Take 1 tablet by mouth at migraine onset. May repeat in 2 hours if headache persists or recurs. 10 tablet 0   traZODone  (DESYREL ) 50 MG tablet Take 1/2-1 tablet by mouth at bedtime as needed for sleep. Do not drive after taking (Patient not taking: Reported on 10/21/2023) 30 tablet 1   No current facility-administered medications on file prior to visit.    BP 134/82   Pulse 86   Temp (!) 97.2 F (36.2 C) (Temporal)   Ht 5' 3 (1.6 m)   Wt 226 lb (102.5 kg)   LMP 08/14/2018   SpO2 98%   BMI 40.03 kg/m  Objective:   Physical Exam Exam conducted with a chaperone present.  HENT:     Right Ear: Tympanic membrane and ear canal normal.     Left Ear: Tympanic membrane and ear canal normal.  Eyes:     Pupils: Pupils are equal, round, and reactive to light.  Cardiovascular:     Rate and Rhythm: Normal rate and regular rhythm.  Pulmonary:     Effort: Pulmonary effort is normal.     Breath sounds: Normal breath sounds.  Abdominal:     General: Bowel sounds are normal.     Palpations: Abdomen is soft.     Tenderness: There is no abdominal tenderness.  Genitourinary:    Labia:        Right: No rash, tenderness or lesion.        Left: No rash, tenderness or lesion.      Vagina: Normal.     Cervix: Normal.     Uterus: Normal.      Adnexa: Right adnexa normal and left adnexa normal.  Musculoskeletal:        General: Normal range of motion.     Cervical back: Neck supple.  Skin:    General: Skin is warm and dry.  Neurological:     Mental Status: She is alert and oriented to person, place, and time.     Cranial Nerves: No cranial nerve deficit.     Deep Tendon Reflexes:     Reflex Scores:      Patellar reflexes are 2+ on the right side and 2+ on the left side. Psychiatric:        Mood and Affect: Mood normal.           Assessment & Plan:  Preventative health  care Assessment & Plan: Immunizations UTD. Pap smear due, completed today. Mammogram due, orders placed. Colonoscopy UTD, due 2032  Discussed the  importance of a healthy diet and regular exercise in order for weight loss, and to reduce the risk of further co-morbidity.  Exam stable. Labs pending.  Follow up in 1 year for repeat physical.    Chronic migraine without aura without status migrainosus, not intractable Assessment & Plan: Controlled.  Continue sumatriptan  25 mg PRN   Obstructive sleep apnea syndrome Assessment & Plan: Controlled.  Continue CPAP nightly.   Gastroesophageal reflux disease, unspecified whether esophagitis present Assessment & Plan: Controlled.  No concerns today. Continue Tums PRN   Chronic left-sided low back pain with left-sided sciatica Assessment & Plan: Ongoing.  Following with orthopedics. She will be moving forward with physical therapy.   Class 3 severe obesity due to excess calories with body mass index (BMI) of 40.0 to 44.9 in adult Assessment & Plan: Ongoing.  She continues with frustration.  Will try Wegovy  again to see if insurance will cover.   Start semaglutide  (Wegovy ) for weight loss. Start by injecting 0.25 mg into the skin once weekly for 4 weeks, then increase to 0.5 mg once weekly thereafter.  Discussed common side effects and instructions for use.   She will update. Follow up in 2-3 months if applicable.    History of abnormal cervical Pap smear Assessment & Plan: Repeat pap smear pending today.   Hyperlipidemia, unspecified hyperlipidemia type Assessment & Plan: Will try Wegovy  again.  Hold rosuvastatin  for 2 weeks due to myalgias. She will update. Repeat lipid panel pending.  Orders: -     Comprehensive metabolic panel with GFR -     Lipid panel -     Hemoglobin A1c  Screening mammogram for breast cancer -     3D Screening Mammogram, Left and Right; Future  Screening for cervical cancer -      Cytology - PAP  Insomnia, unspecified type Assessment & Plan: Controlled.  Continue CPAP nightly.   Prediabetes Assessment & Plan: Repeat A1c pending.         Angela Gregory K Phelan Schadt, NP

## 2023-10-21 NOTE — Assessment & Plan Note (Signed)
 Repeat pap smear pending today.

## 2023-10-21 NOTE — Assessment & Plan Note (Signed)
 Ongoing.  Following with orthopedics. She will be moving forward with physical therapy.

## 2023-10-21 NOTE — Assessment & Plan Note (Signed)
Controlled.  Continue sumatriptan 25 mg PRN. 

## 2023-10-21 NOTE — Addendum Note (Signed)
 Addended by: Taniyah Ballow K on: 10/21/2023 08:39 AM   Modules accepted: Orders

## 2023-10-21 NOTE — Patient Instructions (Signed)
 Stop by the lab prior to leaving today. I will notify you of your results once received.   Start semaglutide [facility]) for weight loss. Start by injecting 0.25 mg into the skin once weekly for 4 weeks, then increase to 0.5 mg once weekly thereafter.  Please notify me once you have used your last 0.25 mg pen so that I can send the 0.5 mg dose to your pharmacy.   It was a pleasure to see you today!

## 2023-10-21 NOTE — Assessment & Plan Note (Signed)
 Immunizations UTD. Pap smear due, completed today. Mammogram due, orders placed. Colonoscopy UTD, due 2032  Discussed the importance of a healthy diet and regular exercise in order for weight loss, and to reduce the risk of further co-morbidity.  Exam stable. Labs pending.  Follow up in 1 year for repeat physical.

## 2023-10-21 NOTE — Assessment & Plan Note (Addendum)
 Will try Wegovy  again.  Hold rosuvastatin  for 2 weeks due to myalgias. She will update. Repeat lipid panel pending.

## 2023-10-21 NOTE — Assessment & Plan Note (Signed)
 Ongoing.  She continues with frustration.  Will try Wegovy  again to see if insurance will cover.   Start semaglutide  (Wegovy ) for weight loss. Start by injecting 0.25 mg into the skin once weekly for 4 weeks, then increase to 0.5 mg once weekly thereafter.  Discussed common side effects and instructions for use.   She will update. Follow up in 2-3 months if applicable.

## 2023-10-21 NOTE — Assessment & Plan Note (Signed)
 Controlled.  No concerns today. Continue Tums PRN

## 2023-10-23 ENCOUNTER — Other Ambulatory Visit: Payer: Self-pay | Admitting: Primary Care

## 2023-10-23 DIAGNOSIS — G4733 Obstructive sleep apnea (adult) (pediatric): Secondary | ICD-10-CM

## 2023-10-23 DIAGNOSIS — E66813 Obesity, class 3: Secondary | ICD-10-CM

## 2023-10-23 DIAGNOSIS — E785 Hyperlipidemia, unspecified: Secondary | ICD-10-CM

## 2023-10-23 DIAGNOSIS — R7303 Prediabetes: Secondary | ICD-10-CM

## 2023-10-23 LAB — CYTOLOGY - PAP
Comment: NEGATIVE
Diagnosis: NEGATIVE
Diagnosis: REACTIVE
High risk HPV: NEGATIVE

## 2023-10-23 MED ORDER — ZEPBOUND 2.5 MG/0.5ML ~~LOC~~ SOAJ: 2.5000 mg | SUBCUTANEOUS | 0 refills | Status: AC

## 2023-10-29 ENCOUNTER — Encounter

## 2023-12-01 ENCOUNTER — Encounter

## 2023-12-19 ENCOUNTER — Ambulatory Visit
Admission: RE | Admit: 2023-12-19 | Discharge: 2023-12-19 | Disposition: A | Discharge status: Home / Self Care | Source: Ambulatory Visit | Attending: Primary Care | Admitting: Primary Care

## 2023-12-19 DIAGNOSIS — Z1231 Encounter for screening mammogram for malignant neoplasm of breast: Secondary | ICD-10-CM | POA: Insufficient documentation

## 2024-01-02 ENCOUNTER — Other Ambulatory Visit: Payer: Self-pay | Admitting: Primary Care

## 2024-01-02 DIAGNOSIS — E785 Hyperlipidemia, unspecified: Secondary | ICD-10-CM
